# Patient Record
Sex: Female | Born: 1970 | Hispanic: No | Marital: Single | Smoking: Former smoker
Health system: Southern US, Community
[De-identification: ages and names within clinical notes are randomized; demographics above are authoritative.]

## PROBLEM LIST (undated history)

## (undated) DIAGNOSIS — I499 Cardiac arrhythmia, unspecified: Secondary | ICD-10-CM

## (undated) DIAGNOSIS — Z789 Other specified health status: Secondary | ICD-10-CM

## (undated) DIAGNOSIS — E785 Hyperlipidemia, unspecified: Secondary | ICD-10-CM

## (undated) DIAGNOSIS — I10 Essential (primary) hypertension: Secondary | ICD-10-CM

## (undated) HISTORY — PX: TUBAL LIGATION: SHX77

## (undated) HISTORY — DX: Essential (primary) hypertension: I10

## (undated) HISTORY — DX: Cardiac arrhythmia, unspecified: I49.9

## (undated) HISTORY — DX: Hyperlipidemia, unspecified: E78.5

---

## 1998-11-14 ENCOUNTER — Emergency Department (HOSPITAL_COMMUNITY): Admission: EM | Admit: 1998-11-14 | Discharge: 1998-11-14 | Payer: Self-pay | Admitting: Emergency Medicine

## 1998-11-14 ENCOUNTER — Encounter: Payer: Self-pay | Admitting: Emergency Medicine

## 1999-09-03 ENCOUNTER — Emergency Department (HOSPITAL_COMMUNITY): Admission: EM | Admit: 1999-09-03 | Discharge: 1999-09-03 | Payer: Self-pay | Admitting: Emergency Medicine

## 2000-05-14 ENCOUNTER — Emergency Department (HOSPITAL_COMMUNITY): Admission: EM | Admit: 2000-05-14 | Discharge: 2000-05-14 | Payer: Self-pay | Admitting: Emergency Medicine

## 2001-04-24 ENCOUNTER — Emergency Department (HOSPITAL_COMMUNITY): Admission: EM | Admit: 2001-04-24 | Discharge: 2001-04-25 | Payer: Self-pay

## 2001-10-20 ENCOUNTER — Emergency Department (HOSPITAL_COMMUNITY): Admission: EM | Admit: 2001-10-20 | Discharge: 2001-10-20 | Payer: Self-pay | Admitting: *Deleted

## 2002-03-15 ENCOUNTER — Emergency Department (HOSPITAL_COMMUNITY): Admission: EM | Admit: 2002-03-15 | Discharge: 2002-03-15 | Payer: Self-pay | Admitting: *Deleted

## 2002-10-07 ENCOUNTER — Inpatient Hospital Stay (HOSPITAL_COMMUNITY): Admission: AD | Admit: 2002-10-07 | Discharge: 2002-10-07 | Payer: Self-pay | Admitting: Obstetrics & Gynecology

## 2007-03-08 ENCOUNTER — Emergency Department (HOSPITAL_COMMUNITY): Admission: EM | Admit: 2007-03-08 | Discharge: 2007-03-08 | Payer: Self-pay | Admitting: Emergency Medicine

## 2016-12-15 ENCOUNTER — Ambulatory Visit (HOSPITAL_COMMUNITY)
Admission: EM | Admit: 2016-12-15 | Discharge: 2016-12-15 | Disposition: A | Discharge status: Home / Self Care | Payer: BLUE CROSS/BLUE SHIELD | Attending: Family Medicine | Admitting: Family Medicine

## 2016-12-15 ENCOUNTER — Encounter (HOSPITAL_COMMUNITY): Payer: Self-pay | Admitting: Emergency Medicine

## 2016-12-15 DIAGNOSIS — Z3202 Encounter for pregnancy test, result negative: Secondary | ICD-10-CM | POA: Diagnosis not present

## 2016-12-15 DIAGNOSIS — R300 Dysuria: Secondary | ICD-10-CM | POA: Diagnosis not present

## 2016-12-15 DIAGNOSIS — N10 Acute tubulo-interstitial nephritis: Secondary | ICD-10-CM | POA: Diagnosis not present

## 2016-12-15 DIAGNOSIS — R109 Unspecified abdominal pain: Secondary | ICD-10-CM

## 2016-12-15 LAB — POCT URINALYSIS DIP (DEVICE)
Bilirubin Urine: NEGATIVE
Glucose, UA: NEGATIVE mg/dL
Nitrite: NEGATIVE
Protein, ur: 100 mg/dL — AB
Specific Gravity, Urine: 1.015 (ref 1.005–1.030)
Urobilinogen, UA: 1 mg/dL (ref 0.0–1.0)
pH: 5.5 (ref 5.0–8.0)

## 2016-12-15 LAB — POCT PREGNANCY, URINE: Preg Test, Ur: NEGATIVE

## 2016-12-15 MED ORDER — KETOROLAC TROMETHAMINE 30 MG/ML IJ SOLN
INTRAMUSCULAR | Status: AC
  Filled 2016-12-15: qty 1

## 2016-12-15 MED ORDER — LIDOCAINE HCL (PF) 1 % IJ SOLN
INTRAMUSCULAR | Status: AC
  Filled 2016-12-15: qty 2

## 2016-12-15 MED ORDER — CIPROFLOXACIN HCL 500 MG PO TABS: 500.0000 mg | ORAL_TABLET | Freq: Two times a day (BID) | ORAL | 0 refills | Status: DC

## 2016-12-15 MED ORDER — CEFTRIAXONE SODIUM 1 G IJ SOLR
INTRAMUSCULAR | Status: AC
  Filled 2016-12-15: qty 10

## 2016-12-15 MED ORDER — CEFTRIAXONE SODIUM 1 G IJ SOLR
1.0000 g | Freq: Once | INTRAMUSCULAR | Status: AC
  Administered 2016-12-15: 1 g via INTRAMUSCULAR

## 2016-12-15 MED ORDER — ONDANSETRON 4 MG PO TBDP: 4.0000 mg | ORAL_TABLET | Freq: Three times a day (TID) | ORAL | 0 refills | Status: DC | PRN

## 2016-12-15 MED ORDER — KETOROLAC TROMETHAMINE 30 MG/ML IJ SOLN
30.0000 mg | Freq: Once | INTRAMUSCULAR | Status: AC
  Administered 2016-12-15: 30 mg via INTRAMUSCULAR

## 2016-12-15 NOTE — ED Triage Notes (Signed)
The patient presented to the [facility] with a complaint of right flank and right lower back paint that started this am. The patient denied dysuria but did report urinary frequency.

## 2016-12-15 NOTE — Discharge Instructions (Signed)
Please return here or to the Emergency Department immediately should you feel worse in any way or have any of the following symptoms: increasing or different abdominal pain, vomiting, increasing fevers, or shaking chills.

## 2016-12-18 NOTE — ED Provider Notes (Signed)
  [place]    ASSESSMENT & PLAN:  1. Acute pyelonephritis     Meds ordered this encounter  Medications  . ciprofloxacin (CIPRO) 500 MG tablet    Sig: Take 1 tablet (500 mg total) by mouth 2 (two) times daily.    Dispense:  14 tablet    Refill:  0  . ketorolac (TORADOL) 30 MG/ML injection 30 mg  . cefTRIAXone (ROCEPHIN) injection 1 g  . ondansetron (ZOFRAN-ODT) 4 MG disintegrating tablet    Sig: Take 1 tablet (4 mg total) by mouth every 8 (eight) hours as needed for nausea or vomiting.    Dispense:  15 tablet    Refill:  0   Ensure adequate fluid intake. Feeling much better after IM Toradol. Begin oral antibiotic tonight. Urine culture sent. Will notify patient when results available. Will follow up with her PCP or here if not showing improvement over the next 48 hours, sooner if needed.  Outlined signs and symptoms indicating need for more acute intervention. Patient verbalized understanding. After Visit Summary given.  SUBJECTIVE:  Brenda Sweeney is a 46 y.o. female who complains of urinary frequency, urgency and dysuria (burning) for the past 1-2 days. Mild R flank pain. Subjective fever and chills. No abnormal vaginal discharge or bleeding. Hematuria: not present. Normal PO intake. No abdominal pain. No self treatment. No specific aggravating or alleviating factors reported.  LMP: Patient's last menstrual period was 12/14/2016 (exact date).  ROS: As in HPI. All other systems negative.  OBJECTIVE:  Vitals:   12/15/16 1809  BP: (!) 121/58  Pulse: 88  Resp: 20  Temp: 100.2 F (37.9 C)  TempSrc: Oral  SpO2: 98%   No apparent distress. CV: RRR. Lungs: CTAB. Abdomen is soft without tenderness, guarding, mass, rebound or organomegaly. Mild CVA tenderness on the R. No inguinal adenopathy noted. Ext: No edema. Psych: A&O  Labs Reviewed  POCT URINALYSIS DIP (DEVICE) - Abnormal; Notable for the following:       Result Value   Ketones, ur  TRACE (*)    Hgb urine dipstick LARGE (*)    Protein, ur 100 (*)    Leukocytes, UA SMALL (*)    All other components within normal limits  POCT PREGNANCY, URINE    Allergies  Allergen Reactions  . Prozac [Fluoxetine Hcl] Other (See Comments)    Made my face lock up    No PMH of kidney infection.  Social History   Social History  . Marital status: Single    Spouse name: N/A  . Number of children: N/A  . Years of education: N/A   Occupational History  . Not on file.   Social History Main Topics  . Smoking status: Current Every Day Smoker    Packs/day: 1.00    Years: 30.00    Types: Cigarettes  . Smokeless tobacco: Never Used  . Alcohol use No  . Drug use: No  . Sexual activity: Not on file   Other Topics Concern  . Not on file   Social History Narrative  . No narrative on file   No FH of renal dz.    Vanessa Kick, MD 12/18/16 1146

## 2018-09-05 ENCOUNTER — Emergency Department (HOSPITAL_COMMUNITY)
Admission: EM | Admit: 2018-09-05 | Discharge: 2018-09-05 | Disposition: A | Discharge status: Home / Self Care | Payer: BLUE CROSS/BLUE SHIELD | Attending: Emergency Medicine | Admitting: Emergency Medicine

## 2018-09-05 ENCOUNTER — Other Ambulatory Visit: Payer: Self-pay

## 2018-09-05 DIAGNOSIS — J0300 Acute streptococcal tonsillitis, unspecified: Secondary | ICD-10-CM | POA: Insufficient documentation

## 2018-09-05 DIAGNOSIS — J020 Streptococcal pharyngitis: Secondary | ICD-10-CM

## 2018-09-05 DIAGNOSIS — F17210 Nicotine dependence, cigarettes, uncomplicated: Secondary | ICD-10-CM | POA: Insufficient documentation

## 2018-09-05 LAB — GROUP A STREP BY PCR: Group A Strep by PCR: DETECTED — AB

## 2018-09-05 MED ORDER — LIDOCAINE VISCOUS HCL 2 % MT SOLN
15.0000 mL | Freq: Once | OROMUCOSAL | Status: AC
  Administered 2018-09-05: 15 mL via OROMUCOSAL
  Filled 2018-09-05: qty 15

## 2018-09-05 MED ORDER — DEXAMETHASONE 4 MG PO TABS
10.0000 mg | ORAL_TABLET | Freq: Once | ORAL | Status: AC
Start: 2018-09-05 — End: 2018-09-05
  Administered 2018-09-05: 10 mg via ORAL
  Filled 2018-09-05: qty 3

## 2018-09-05 MED ORDER — PENICILLIN G BENZATHINE 1200000 UNIT/2ML IM SUSP
1.2000 10*6.[IU] | Freq: Once | INTRAMUSCULAR | Status: DC
  Filled 2018-09-05: qty 2

## 2018-09-05 NOTE — ED Provider Notes (Signed)
[facility] EMERGENCY DEPARTMENT Provider Note   CSN: [id number] Arrival date & time: 09/05/18  1726     History   Chief Complaint Chief Complaint  Patient presents with  . Sore Throat    HPI Brenda Sweeney is a 48 y.o. female who presents with a sore throat.  No significant past medical history.  The patient states for the past week and a half she has had a sore throat with painful swallowing.  The left side of her neck is tender.  She called out of work all last week because it hurt so much.  She has not taken anything for pain.  She has been doing warm salt gargles which is provided temporary relief.  She denies fever, chills, headache, ear pain, runny nose, cough or congestion.  Her coworkers son had strep throat but she has not had any direct sick contacts.  He is a current smoker.  No history of reflux.     HPI  No past medical history on file.  There are no active problems to display for this patient.   Past Surgical History:  Procedure Laterality Date  . TUBAL LIGATION       OB History   No obstetric history on file.      Home Medications    Prior to Admission medications   Medication Sig Start Date End Date Taking? Authorizing Provider  ciprofloxacin (CIPRO) 500 MG tablet Take 1 tablet (500 mg total) by mouth 2 (two) times daily. 12/15/16   Vanessa Kick, MD  ondansetron (ZOFRAN-ODT) 4 MG disintegrating tablet Take 1 tablet (4 mg total) by mouth every 8 (eight) hours as needed for nausea or vomiting. 12/15/16   Vanessa Kick, MD    Family History No family history on file.  Social History Social History   Tobacco Use  . Smoking status: Current Every Day Smoker    Packs/day: 1.00    Years: 30.00    Pack years: 30.00    Types: Cigarettes  . Smokeless tobacco: Never Used  Substance Use Topics  . Alcohol use: No  . Drug use: No     Allergies   Prozac [fluoxetine hcl]   Review of Systems Review of Systems   Constitutional: Negative for fever.  HENT: Positive for sore throat.      Physical Exam Updated Vital Signs BP (!) 137/47   Pulse 77   Temp 99.2 F (37.3 C) (Oral)   Resp 18   Ht 5\' 5"  (1.651 m)   Wt 70.3 kg   SpO2 100%   BMI 25.79 kg/m   Physical Exam Vitals signs and nursing note reviewed.  Constitutional:      General: She is not in acute distress.    Appearance: Normal appearance. She is well-developed. She is not ill-appearing.  HENT:     Head: Normocephalic and atraumatic.     Right Ear: Tympanic membrane normal.     Left Ear: Tympanic membrane normal.     Nose: Nose normal.     Mouth/Throat:     Lips: Pink.     Mouth: Mucous membranes are moist.     Pharynx: Uvula midline. Pharyngeal swelling and posterior oropharyngeal erythema present.     Tonsils: No tonsillar exudate or tonsillar abscesses.  Eyes:     General: No scleral icterus.       Right eye: No discharge.        Left eye: No discharge.     Conjunctiva/sclera: Conjunctivae normal.  Pupils: Pupils are equal, round, and reactive to light.  Neck:     Musculoskeletal: Normal range of motion.  Cardiovascular:     Rate and Rhythm: Normal rate.  Pulmonary:     Effort: Pulmonary effort is normal. No respiratory distress.  Abdominal:     General: There is no distension.  Skin:    General: Skin is warm and dry.  Neurological:     Mental Status: She is alert and oriented to person, place, and time.  Psychiatric:        Behavior: Behavior normal.      ED Treatments / Results  Labs (all labs ordered are listed, but only abnormal results are displayed) Labs Reviewed  GROUP A STREP BY PCR - Abnormal; Notable for the following components:      Result Value   Group A Strep by PCR DETECTED (*)    All other components within normal limits    EKG    Radiology No results found.  Procedures Procedures (including critical care time)  Medications Ordered in ED Medications  penicillin g  benzathine (BICILLIN LA) 1200000 UNIT/2ML injection 1.2 Million Units (has no administration in time range)  dexamethasone (DECADRON) tablet 10 mg (10 mg Oral Given 09/05/18 1826)  lidocaine (XYLOCAINE) 2 % viscous mouth solution 15 mL (15 mLs Mouth/Throat Given 09/05/18 1827)     Initial Impression / Assessment and Plan / ED Course  I have reviewed the triage vital signs and the nursing notes.  Pertinent labs & imaging results that were available during my care of the patient were reviewed by me and considered in my medical decision making (see chart for details).  48 year old female presents with sore throat for over a week.  Vital signs are normal.  On exam she has pharyngeal erythema without tonsillar exudate or swelling.  Strep test is positive.  She is given a dose of Decadron and lidocaine and felt better afterwards.  She is opting for IM PCN.  Work note was given  Final Clinical Impressions(s) / ED Diagnoses   Final diagnoses:  Strep pharyngitis    ED Discharge Orders    None       Recardo Evangelist, PA-C 09/05/18 1853    Daleen Bo, MD 09/07/18 1021

## 2018-09-05 NOTE — ED Triage Notes (Signed)
Pt reports painful swallowing for the last week. Denies recent fever. Vss.

## 2019-10-06 ENCOUNTER — Encounter (HOSPITAL_COMMUNITY): Payer: Self-pay | Admitting: Emergency Medicine

## 2019-10-06 ENCOUNTER — Emergency Department (HOSPITAL_COMMUNITY)
Admission: EM | Admit: 2019-10-06 | Discharge: 2019-10-06 | Disposition: A | Discharge status: Home / Self Care | Payer: Self-pay | Attending: Emergency Medicine | Admitting: Emergency Medicine

## 2019-10-06 ENCOUNTER — Emergency Department (HOSPITAL_COMMUNITY): Payer: Self-pay

## 2019-10-06 ENCOUNTER — Other Ambulatory Visit: Payer: Self-pay

## 2019-10-06 DIAGNOSIS — R109 Unspecified abdominal pain: Secondary | ICD-10-CM | POA: Insufficient documentation

## 2019-10-06 DIAGNOSIS — D649 Anemia, unspecified: Secondary | ICD-10-CM | POA: Insufficient documentation

## 2019-10-06 DIAGNOSIS — F17210 Nicotine dependence, cigarettes, uncomplicated: Secondary | ICD-10-CM | POA: Insufficient documentation

## 2019-10-06 DIAGNOSIS — N3000 Acute cystitis without hematuria: Secondary | ICD-10-CM | POA: Insufficient documentation

## 2019-10-06 DIAGNOSIS — M549 Dorsalgia, unspecified: Secondary | ICD-10-CM | POA: Insufficient documentation

## 2019-10-06 DIAGNOSIS — N840 Polyp of corpus uteri: Secondary | ICD-10-CM | POA: Insufficient documentation

## 2019-10-06 DIAGNOSIS — M25551 Pain in right hip: Secondary | ICD-10-CM | POA: Insufficient documentation

## 2019-10-06 LAB — CBC WITH DIFFERENTIAL/PLATELET
Abs Immature Granulocytes: 0.05 10*3/uL (ref 0.00–0.07)
Basophils Absolute: 0.1 10*3/uL (ref 0.0–0.1)
Basophils Relative: 1 %
Eosinophils Absolute: 0.4 10*3/uL (ref 0.0–0.5)
Eosinophils Relative: 3 %
HCT: 32.1 % — ABNORMAL LOW (ref 36.0–46.0)
Hemoglobin: 9.4 g/dL — ABNORMAL LOW (ref 12.0–15.0)
Immature Granulocytes: 0 %
Lymphocytes Relative: 29 %
Lymphs Abs: 3.3 10*3/uL (ref 0.7–4.0)
MCH: 23.7 pg — ABNORMAL LOW (ref 26.0–34.0)
MCHC: 29.3 g/dL — ABNORMAL LOW (ref 30.0–36.0)
MCV: 80.9 fL (ref 80.0–100.0)
Monocytes Absolute: 0.9 10*3/uL (ref 0.1–1.0)
Monocytes Relative: 8 %
Neutro Abs: 6.8 10*3/uL (ref 1.7–7.7)
Neutrophils Relative %: 59 %
Platelets: 582 10*3/uL — ABNORMAL HIGH (ref 150–400)
RBC: 3.97 MIL/uL (ref 3.87–5.11)
RDW: 17.4 % — ABNORMAL HIGH (ref 11.5–15.5)
WBC: 11.5 10*3/uL — ABNORMAL HIGH (ref 4.0–10.5)
nRBC: 0 % (ref 0.0–0.2)

## 2019-10-06 LAB — BASIC METABOLIC PANEL
Anion gap: 9 (ref 5–15)
BUN: 11 mg/dL (ref 6–20)
CO2: 25 mmol/L (ref 22–32)
Calcium: 9.5 mg/dL (ref 8.9–10.3)
Chloride: 105 mmol/L (ref 98–111)
Creatinine, Ser: 1.01 mg/dL — ABNORMAL HIGH (ref 0.44–1.00)
GFR calc Af Amer: >60 mL/min (ref >60)
GFR calc non Af Amer: >60 mL/min (ref >60)
Glucose, Bld: 106 mg/dL — ABNORMAL HIGH (ref 70–99)
Potassium: 4.1 mmol/L (ref 3.5–5.1)
Sodium: 139 mmol/L (ref 135–145)

## 2019-10-06 LAB — URINALYSIS, ROUTINE W REFLEX MICROSCOPIC
Bilirubin Urine: NEGATIVE
Glucose, UA: NEGATIVE mg/dL
Ketones, ur: NEGATIVE mg/dL
Nitrite: NEGATIVE
Protein, ur: NEGATIVE mg/dL
Specific Gravity, Urine: 1.02 (ref 1.005–1.030)
pH: 6 (ref 5.0–8.0)

## 2019-10-06 LAB — I-STAT BETA HCG BLOOD, ED (MC, WL, AP ONLY): I-stat hCG, quantitative: <5 m[IU]/mL (ref <5)

## 2019-10-06 MED ORDER — CEPHALEXIN 500 MG PO CAPS: 500.0000 mg | ORAL_CAPSULE | Freq: Three times a day (TID) | ORAL | 0 refills | Status: DC

## 2019-10-06 MED ORDER — IOHEXOL 300 MG/ML  SOLN
100.0000 mL | Freq: Once | INTRAMUSCULAR | Status: AC | PRN
  Administered 2019-10-06: 100 mL via INTRAVENOUS

## 2019-10-06 NOTE — ED Triage Notes (Signed)
Patient reports left flank pain for 1 month, denies injury , no dysuria or hematuria , patient added right hip pain for 3 months.

## 2019-10-06 NOTE — ED Provider Notes (Signed)
[facility] EM[facility]GENCY DEPARTMENT Provider Note   CSN: [id number] Arrival date & time: 10/06/19  0211     History Chief Complaint  Patient presents with  . Flank Pain    Brenda Sweeney is a 49 y.o. female.  Patient with no sniffing past medical history presents the emergency department today with complaint of left-sided flank pain and right hip pain.  Patient states that she began having pain over the lateral portion of her right hip which is much worse with activity movement such as walking upstairs proximately 4 months ago.  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  About a month ago she developed a dull pain in her left flank, left lateral ribs which radiated into her back.  This was mild at first but as become progressively more severe.  Currently it is waxing and waning but typically always present.  It hurts with movement and when she palpates the area.  It hurts when she takes a deep breath in.  She denies cough or shortness of breath.  No fever, nausea, vomiting, or diarrhea.  No hematuria or irritative UTI symptoms including dysuria, increased frequency or urgency.  Patient reports chronic weight fluctuations but no recent significant weight gain or loss.  She has not taken any medications for her symptoms stating she does not like to take medications.        History reviewed. No pertinent past medical history.  There are no problems to display for this patient.   Past Surgical History:  Procedure Laterality Date  . TUBAL LIGATION       OB History   No obstetric history on file.     No family history on file.  Social History   Tobacco Use  . Smoking status: Current Every Day Smoker    Packs/day: 1.00    Years: 30.00    Pack years: 30.00    Types: Cigarettes  . Smokeless tobacco: Never Used  Vaping Use  . Vaping Use: Never used  Substance Use Topics  . Alcohol use: No  . Drug use: No    Home  Medications Prior to Admission medications   Medication Sig Start Date End Date Taking? Authorizing Provider  ciprofloxacin (CIPRO) 500 MG tablet Take 1 tablet (500 mg total) by mouth 2 (two) times daily. 12/15/16   Vanessa Kick, MD  ondansetron (ZOFRAN-ODT) 4 MG disintegrating tablet Take 1 tablet (4 mg total) by mouth every 8 (eight) hours as needed for nausea or vomiting. 12/15/16   Vanessa Kick, MD    Allergies    Prozac [fluoxetine hcl]  Review of Systems   Review of Systems  Constitutional: Negative for fever.  HENT: Negative for rhinorrhea and sore throat.   Eyes: Negative for redness.  Respiratory: Negative for cough.   Cardiovascular: Negative for chest pain.  Gastrointestinal: Negative for abdominal pain, diarrhea, nausea and vomiting.  Genitourinary: Positive for flank pain. Negative for dysuria, frequency, hematuria, urgency, vaginal bleeding and vaginal discharge.  Musculoskeletal: Positive for arthralgias and back pain. Negative for myalgias.  Skin: Negative for rash.  Neurological: Negative for headaches.    Physical Exam Updated Vital Signs BP (!) 134/67 (BP Location: Left Arm)   Pulse 70   Temp 97.9 F (36.6 C) (Oral)   Resp 17   Ht 5\' 5"  (1.651 m)   Wt 80 kg   LMP 09/22/2019   SpO2 100%   BMI 29.35 kg/m   Physical Exam Vitals and  nursing note reviewed.  Constitutional:      Appearance: She is well-developed.  HENT:     Head: Normocephalic and atraumatic.  Eyes:     General:        Right eye: No discharge.        Left eye: No discharge.     Conjunctiva/sclera: Conjunctivae normal.  Cardiovascular:     Rate and Rhythm: Normal rate and regular rhythm.     Heart sounds: Normal heart sounds.  Pulmonary:     Effort: Pulmonary effort is normal.     Breath sounds: Normal breath sounds.  Abdominal:     Palpations: Abdomen is soft.     Tenderness: There is abdominal tenderness. There is no right CVA tenderness or left CVA tenderness.     Comments:  Patient with tenderness to palpation over the left lateral abdomen starting at just below the ribs and extending up into the mid ribs.  Musculoskeletal:        General: Tenderness present.     Cervical back: Normal range of motion and neck supple.     Comments: Patient with tenderness of the right hip just posterior to the greater trochanter of the femur.  No swelling or skin changes.  Skin:    General: Skin is warm and dry.  Neurological:     Mental Status: She is alert.     ED Results / Procedures / Treatments   Labs (all labs ordered are listed, but only abnormal results are displayed) Labs Reviewed  URINALYSIS, ROUTINE W REFLEX MICROSCOPIC - Abnormal; Notable for the following components:      Result Value   APPearance HAZY (*)    Hgb urine dipstick SMALL (*)    Leukocytes,Ua LARGE (*)    Bacteria, UA RARE (*)    All other components within normal limits  CBC WITH DIFFERENTIAL/PLATELET - Abnormal; Notable for the following components:   WBC 11.5 (*)    Hemoglobin 9.4 (*)    HCT 32.1 (*)    MCH 23.7 (*)    MCHC 29.3 (*)    RDW 17.4 (*)    Platelets 582 (*)    All other components within normal limits  BASIC METABOLIC PANEL - Abnormal; Notable for the following components:   Glucose, Bld 106 (*)    Creatinine, Ser 1.01 (*)    All other components within normal limits  URINE CULTURE  I-STAT BETA HCG BLOOD, ED ([facility], [facility], [facility] ONLY)    EKG None  Radiology DG Chest 2 View  Result Date: 10/06/2019 CLINICAL DATA:  49 year old female with history of left lateral flank and chest pain. EXAM: CHEST - 2 VIEW COMPARISON:  No priors. FINDINGS: Lung volumes are normal. No consolidative airspace disease. No pleural effusions. No pneumothorax. No pulmonary nodule or mass noted. Pulmonary vasculature and the cardiomediastinal silhouette are within normal limits. IMPRESSION: No radiographic evidence of acute cardiopulmonary disease. Electronically Signed   By: Vinnie Langton M.D.   On:  10/06/2019 10:32   CT ABDOMEN PELVIS W CONTRAST  Result Date: 10/06/2019 CLINICAL DATA:  49 year old female with history of left lateral flank pain for 1 month and right lateral hip pain for 1 month. EXAM: CT ABDOMEN AND PELVIS WITH CONTRAST TECHNIQUE: Multidetector CT imaging of the abdomen and pelvis was performed using the standard protocol following bolus administration of intravenous contrast. CONTRAST:  143mL OMNIPAQUE IOHEXOL 300 MG/ML  SOLN COMPARISON:  No priors. FINDINGS: Lower chest: Mild linear scarring in the left lower lobe.  Hepatobiliary: Subcentimeter low-attenuation lesions between segments 2 and 3 of the liver and in segment 5 of the liver, too small to definitively characterize, but favored to represent tiny cysts and/or biliary hamartomas. No other suspicious hepatic lesions. No intra or extrahepatic biliary ductal dilatation. Gallbladder is normal in appearance. Pancreas: No pancreatic mass. No pancreatic ductal dilatation. No pancreatic or peripancreatic fluid collections or inflammatory changes. Spleen: Unremarkable. Adrenals/Urinary Tract: Bilateral kidneys and adrenal glands are normal in appearance. No hydroureteronephrosis. Urinary bladder is normal in appearance. Stomach/Bowel: The appearance of the stomach is normal. No pathologic dilatation of small bowel or colon. Normal appendix. Vascular/Lymphatic: No significant atherosclerotic disease, aneurysm or dissection noted in the abdominal or pelvic vasculature no lymphadenopathy noted in the abdomen or pelvis. Reproductive: There is a hypervascular lesion which appears pedunculated extending from the endometrial canal into the lower uterine segment likely partially prolapsing out of the cervical os (axial image 80 of series 3 and sagittal image 63 of series 7) estimated to measure approximately 5.8 x 2.5 x 3.5 cm, likely to represent a large endometrial polyp. Ovaries are unremarkable in appearance. Other: Trace volume of free fluid in  the cul-de-sac likely physiologic. No larger volume of ascites. No pneumoperitoneum. Musculoskeletal: There are no aggressive appearing lytic or blastic lesions noted in the visualized portions of the skeleton. IMPRESSION: 1. No acute findings are noted in the abdomen or pelvis to account for the patient's symptoms. 2. Large lesion in the uterus suspicious for prolapsing endometrial polyp. Further evaluation with pelvic ultrasound is recommended. 3. Small volume of free fluid in the cul-de-sac, presumably physiologic. 4. Additional incidental findings, as above. Electronically Signed   By: Vinnie Langton M.D.   On: 10/06/2019 10:32    Procedures Procedures (including critical care time)  Medications Ordered in ED Medications - No data to display  ED Course  I have reviewed the triage vital signs and the nursing notes.  Pertinent labs & imaging results that were available during my care of the patient were reviewed by me and considered in my medical decision making (see chart for details).  Patient seen and examined.  Patient has unusual constellation of symptoms.  This may be musculoskeletal pain related to her job, however duration and progression of symptoms is concerning.  She has a mildly elevated white blood cell count.  She has significant red and white blood cells in her urine but does not have typical history and symptomatology of cystitis or pyelonephritis.  Patient does have tenderness over her left flank but also extends more superiorly onto the lateral ribs and middle back.  Will obtain chest x-ray and CT of the abdomen pelvis to evaluate for kidney or other abdominal pathology.  We should be able to see the area of the right hip on CT imaging as well.  Patient agrees to proceed with this plan.  Vital signs reviewed and are as follows: BP (!) 134/67 (BP Location: Left Arm)   Pulse 70   Temp 97.9 F (36.6 C) (Oral)   Resp 17   Ht 5\' 5"  (1.651 m)   Wt 80 kg   LMP 09/22/2019    SpO2 100%   BMI 29.35 kg/m   11:00 AM results reviewed with patient at bedside.  We discussed her CT findings which were reassuring.  We did discuss endometrial polyp and need to follow-up with OB/GYN for this.  Discussed negative chest x-ray.  We also talked about her urine test.  Will give trial of Keflex to  see if this helps.  Urine culture ordered.  We also discussed lab work finding including anemia.  At this point, patient would not require transfusion as this is likely chronic, however patient strongly advised to follow-up with PCP for this.  I provided her with referral to the [facility] outpatient clinic as well as the [facility] health and wellness clinic.  At this point patient is comfortable discharged home.  Encouraged rest, use of OTC meds as needed for her symptoms.  The patient was urged to return to the Emergency Department immediately with worsening of current symptoms, worsening abdominal pain, persistent vomiting, blood noted in stools, fever, or any other concerns. The patient verbalized understanding.       MDM Rules/Calculators/A&P                          Patient with left sided pain and right hip pain, worse with movement and palpation.  Imaging today does not reveal cause of her symptoms.  I have low suspicion for PE, ACS, pyelonephritis.  Possibly musculoskeletal in nature.  Work-up did reveal the following problems.  Labs are largely unremarkable.  Hepatic function panel and lipase were not ordered on initial arrival.  I do not feel the patient needs these at this time given the fact that she does not have clinical signs and symptoms of pancreatitis and no right upper quadrant tenderness or gallstones on CT scan.  Anemia: Hemoglobin 9.4.  Relatively symptomatic, no active bleeding.  Probably chronic.  Patient to follow-up with PCP for this.  Discussed signs and symptoms to return including bleeding, shortness of breath, extreme weakness.  Endometrial polyp: Patient  denies active or troublesome vaginal bleeding.  Follow-up with GYN for ultrasound recommended.  Referral given.  Possible UTI: Patient with red blood cells and white blood cells noted in the urine.  Patient denies vaginal bleeding.  She does not have classic UTI symptoms.  Urine culture sent.  Will treat with Keflex in the interim.  She does not have fever, vomiting to suggest pyelonephritis and the findings on CT scan.   Final Clinical Impression(s) / ED Diagnoses Final diagnoses:  Left flank pain  Right hip pain  Endometrial polyp  Acute cystitis without hematuria  Anemia, unspecified type    Rx / DC Orders ED Discharge Orders         Ordered    cephALEXin (KEFLEX) 500 MG capsule  3 times daily     Discontinue  Reprint     10/06/19 1055           Carlisle Cater, PA-C 10/06/19 1104    Davonna Belling, MD 10/06/19 1551

## 2019-10-06 NOTE — ED Notes (Signed)
Patient Alert and oriented to baseline. Stable and ambulatory to baseline. Patient verbalized understanding of the discharge instructions.  Patient belongings were taken by the patient.   

## 2019-10-06 NOTE — Discharge Instructions (Signed)
Please read and follow all provided instructions.  Your diagnoses today include:  1. Left flank pain   2. Right hip pain   3. Endometrial polyp   4. Acute cystitis without hematuria   5. Anemia, unspecified type     Tests performed today include:  Blood counts and electrolytes - showed slightly elevated white blood cell count, moderately low red blood cell count  Blood tests to kidney function - normal  Urine test to look for infection - shows blood and infection fighting cells in the urine  Chest x-ray - was normal  CT scan of your abdomen -did not show any significant problems however does show that you have a endometrial cyst.  This will need to be followed up by an OB/GYN.  See referral.  Vital signs. See below for your results today.   Medications prescribed:   Keflex (cephalexin) - antibiotic  You have been prescribed an antibiotic medicine: take the entire course of medicine even if you are feeling better. Stopping early can cause the antibiotic not to work.  Take any prescribed medications only as directed.  Home care instructions:   Follow any educational materials contained in this packet.  Please follow-up with the provided referrals.  You may use over-the-counter medications such as Tylenol, ibuprofen, or naproxen for any pain that you have.  Follow-up instructions: Please follow-up with your primary care provider in the next 7 days for further evaluation of your symptoms.  See referral.  Return instructions:  SEEK IMMEDIATE MEDICAL ATTENTION IF:  The pain does not go away or becomes severe   A temperature above 101F develops   Repeated vomiting occurs (multiple episodes)   The pain becomes localized to portions of the abdomen. The right side could possibly be appendicitis. In an adult, the left lower portion of the abdomen could be colitis or diverticulitis.   Blood is being passed in stools or vomit (bright red or black tarry stools)   You develop  chest pain, difficulty breathing, dizziness or fainting, or become confused, poorly responsive, or inconsolable (young children)  If you have any other emergent concerns regarding your health  Additional Information: Abdominal (belly) pain can be caused by many things. Your caregiver performed an examination and possibly ordered blood/urine tests and imaging (CT scan, x-rays, ultrasound). Many cases can be observed and treated at home after initial evaluation in the emergency department. Even though you are being discharged home, abdominal pain can be unpredictable. Therefore, you need a repeated exam if your pain does not resolve, returns, or worsens. Most patients with abdominal pain don't have to be admitted to the hospital or have surgery, but serious problems like appendicitis and gallbladder attacks can start out as nonspecific pain. Many abdominal conditions cannot be diagnosed in one visit, so follow-up evaluations are very important.  Your vital signs today were: BP (!) 134/67 (BP Location: Left Arm)    Pulse 70    Temp 97.9 F (36.6 C) (Oral)    Resp 17    Ht 5\' 5"  (1.651 m)    Wt 80 kg    LMP 09/22/2019 Comment: tubal ligation   SpO2 100%    BMI 29.35 kg/m  If your blood pressure (bp) was elevated above 135/85 this visit, please have this repeated by your doctor within one month. --------------

## 2019-10-07 LAB — URINE CULTURE: Culture: <10000 — AB

## 2019-11-02 ENCOUNTER — Inpatient Hospital Stay: Payer: Self-pay | Admitting: Internal Medicine

## 2019-12-12 ENCOUNTER — Other Ambulatory Visit: Payer: Self-pay

## 2019-12-12 ENCOUNTER — Inpatient Hospital Stay (HOSPITAL_COMMUNITY)
Admission: AD | Admit: 2019-12-12 | Discharge: 2019-12-12 | Disposition: A | Discharge status: Home / Self Care | Payer: Self-pay | Attending: Emergency Medicine | Admitting: Emergency Medicine

## 2019-12-12 ENCOUNTER — Encounter (HOSPITAL_COMMUNITY): Payer: Self-pay

## 2019-12-12 DIAGNOSIS — R3915 Urgency of urination: Secondary | ICD-10-CM | POA: Insufficient documentation

## 2019-12-12 DIAGNOSIS — R300 Dysuria: Secondary | ICD-10-CM | POA: Insufficient documentation

## 2019-12-12 DIAGNOSIS — R1032 Left lower quadrant pain: Secondary | ICD-10-CM | POA: Insufficient documentation

## 2019-12-12 DIAGNOSIS — N840 Polyp of corpus uteri: Secondary | ICD-10-CM

## 2019-12-12 DIAGNOSIS — Z9851 Tubal ligation status: Secondary | ICD-10-CM | POA: Insufficient documentation

## 2019-12-12 DIAGNOSIS — N898 Other specified noninflammatory disorders of vagina: Secondary | ICD-10-CM | POA: Insufficient documentation

## 2019-12-12 DIAGNOSIS — R350 Frequency of micturition: Secondary | ICD-10-CM | POA: Insufficient documentation

## 2019-12-12 DIAGNOSIS — F17210 Nicotine dependence, cigarettes, uncomplicated: Secondary | ICD-10-CM | POA: Insufficient documentation

## 2019-12-12 DIAGNOSIS — M549 Dorsalgia, unspecified: Secondary | ICD-10-CM | POA: Insufficient documentation

## 2019-12-12 HISTORY — DX: Other specified health status: Z78.9

## 2019-12-12 LAB — URINALYSIS, ROUTINE W REFLEX MICROSCOPIC
Bilirubin Urine: NEGATIVE
Glucose, UA: NEGATIVE mg/dL
Ketones, ur: NEGATIVE mg/dL
Nitrite: NEGATIVE
Protein, ur: 30 mg/dL — AB
RBC / HPF: >50 RBC/hpf — ABNORMAL HIGH (ref 0–5)
Specific Gravity, Urine: 1.009 (ref 1.005–1.030)
WBC, UA: >50 WBC/hpf — ABNORMAL HIGH (ref 0–5)
pH: 5 (ref 5.0–8.0)

## 2019-12-12 MED ORDER — POLYETHYLENE GLYCOL 3350 17 G PO PACK: 17.0000 g | PACK | Freq: Every day | ORAL | 0 refills | Status: AC

## 2019-12-12 NOTE — ED Triage Notes (Signed)
Patient complains of vaginal discomfort and complains of funny feeling. States that she thinks something protruding from vagina. Reports urinating in small amounts and urgency. States this last a short time and feels like it goes back in.

## 2019-12-12 NOTE — ED Provider Notes (Signed)
MSE was initiated and I personally evaluated the patient and placed orders (if any) at  9:59 AM on December 12, 2019.  Briefly patient is a 49 year old female who presents to the emergency department with concern for abnormal sensation in her vagina.  She states that she feels like something is coming out of it.  She has had some urinary frequency and urgency as well.  On exam she is tearful and mildly tachycardic.  Overall nontoxic without significant abdominal tenderness.  10:09: CONSULT: I discussed with MAU APP Barnetta Chapel who accepts patient in transfer for evaluation.  The patient appears stable so that the remainder of the MSE may be completed by another provider.   Leafy Kindle 12/12/19 1015    Quintella Reichert, MD 12/12/19 1226

## 2019-12-12 NOTE — Discharge Instructions (Signed)
Pelvic Floor Dysfunction  Pelvic floor dysfunction (PFD) is a condition that results when the group of muscles and connective tissues that support the organs in the pelvis (pelvic floor muscles) do not work well. These muscles and their connections form a sling that supports the colon and bladder. In men, these muscles also support the prostate gland. In women, they also support the uterus. PFD causes pelvic floor muscles to be too weak, too tight, or a combination of both. In PFD, muscle movements are not coordinated. This condition may cause bowel or bladder problems. It may also cause pain. What are the causes? This condition may be caused by an injury to the pelvic area or by a weakening of pelvic muscles. This often results from pregnancy and childbirth or other types of strain. In many cases, the exact cause is not known. What increases the risk? The following factors may make you more likely to develop this condition:  Having a condition of chronic bladder tissue inflammation (interstitial cystitis).  Being an older person.  Being overweight.  Radiation treatment for cancer in the pelvic region.  Previous pelvic surgery, such as removal of the uterus (hysterectomy) or prostate gland (prostatectomy). What are the signs or symptoms? Symptoms of this condition vary and may include:  Bladder symptoms, such as: ? Trouble starting urination and emptying the bladder. ? Frequent urinary tract infections. ? Leaking urine when coughing, laughing, or exercising (stress incontinence). ? Having to pass urine urgently or frequently. ? Pain when passing urine.  Bowel symptoms, such as: ? Constipation. ? Urgent or frequent bowel movements. ? Incomplete bowel movements. ? Painful bowel movements. ? Leaking stool or gas.  Unexplained genital or rectal pain.  Genital or rectal muscle spasms.  Low back pain. In women, symptoms of PFD may also include:  A heavy, full, or aching feeling in  the vagina.  A bulge that protrudes into the vagina.  Pain during or after sexual intercourse. How is this diagnosed? This condition may be diagnosed based on:  Your symptoms and medical history.  A physical exam. During the exam, your health care provider may check your pelvic muscles for tightness, spasm, pain, or weakness. This may include a rectal exam and a pelvic exam for women. In some cases, you may have diagnostic tests, such as:  Electrical muscle function tests.  Urine flow testing.  X-ray tests of bowel function.  Ultrasound of the pelvic organs. How is this treated? Treatment for this condition depends on your symptoms. Treatment options include:  Physical therapy. This may include Kegel exercises to help relax or strengthen the pelvic floor muscles.  Biofeedback. This type of therapy provides feedback on how tight your pelvic floor muscles are so that you can learn to control them.  Internal or external massage therapy.  A treatment that involves electrical stimulation of the pelvic floor muscles to help control pain (transcutaneous electrical nerve stimulation, or TENS).  Sound wave therapy (ultrasound) to reduce muscle spasms.  Medicines, such as: ? Muscle relaxants. ? Bladder control medicines. Surgery to reconstruct or support pelvic floor muscles may be an option if other treatments do not help. Follow these instructions at home: Activity  Do your usual activities as told by your health care provider. Ask your health care provider if you should modify any activities.  Do pelvic floor strengthening or relaxing exercises at home as told by your physical therapist. Lifestyle  Maintain a healthy weight.  Eat foods that are high in fiber, such as  beans, whole grains, and fresh fruits and vegetables.  Limit foods that are high in fat and processed sugars, such as fried or sweet foods.  Manage stress with relaxation techniques such as yoga or  meditation. General instructions  If you have problems with leakage: ? Use absorbable pads or wear padded underwear. ? Wash frequently with mild soap. ? Keep your genital and anal area as clean and dry as possible. ? Ask your health care provider if you should try a barrier cream to prevent skin irritation.  Take warm baths to relieve pelvic muscle tension or spasms.  Take over-the-counter and prescription medicines only as told by your health care provider.  Keep all follow-up visits as told by your health care provider. This is important. Contact a health care provider if you:  Are not improving with home care.  Have signs or symptoms of PFD that get worse at home.  Develop new signs or symptoms at home.  Have signs of a urinary tract infection, such as: ? Fever. ? Chills. ? Urinary frequency. ? A burning feeling when urinating.  Have not had a bowel movement in 3 days (constipation). Summary  Pelvic floor dysfunction results when the muscles and connective tissues in your pelvic floor do not work well.  These muscles and their connections form a sling that supports your colon and bladder. In men, these muscles also support the prostate gland. In women, they also support the uterus.  PFD may be caused by an injury to the pelvic area or by a weakening of pelvic muscles.  PFD causes pelvic floor muscles to be too weak, too tight, or a combination of both. Symptoms may vary from person to person.  In most cases, PFD can be treated with physical therapies and medicines. Surgery may be an option if other treatments do not help. This information is not intended to replace advice given to you by your health care provider. Make sure you discuss any questions you have with your health care provider. Document Revised: 09/19/2017 Document Reviewed: 09/19/2017 Elsevier Patient Education  2020 [employer]  

## 2019-12-12 NOTE — MAU Provider Note (Addendum)
History     CSN: [id number]  Arrival date and time: 12/12/19 9892   First Provider Initiated Contact with Patient 12/12/19 1330      Chief Complaint  Patient presents with  . Vaginal Prolapse   HPI: Brenda Sweeney is a 49 y.o. G51P1102 female here with complaints of "something in my vagina" for the past month. She states there is a bulge that appears from her introitus about 3-4x/day. These episodes occur when the patient coughs or strains with a bowel movement/trying to urinate. She described the bulging object as smooth and "not like the rest of her vagina." There is no associated pain in her vagina or pelvis. 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 PMH pertinent for polyp diagnosed in the ED in July, patient does not remember what kind. Her menstrual cycle has been irregular lately but she has had excessive flow, requiring > 130 tampons/month, for a few years. Endorses difficulty emptying her bladder (feels like she can't completely empty and has to strain) and having bowel movements. Last BM was this morning, however, she feels like she cannot pass stool normally, and has to strain while doing so.  Pertinent Gynecological History: Menses: flow is excessive, irregular occurring and usually lasting 3-4 weeks.  Bleeding: dysfunctional uterine bleeding Contraception: none DES exposure: denies Blood transfusions: none Sexually transmitted diseases: no past history Previous GYN Procedures: none  Last mammogram: unknown Date: unknown Last pap: unknown Date: unknown   Past Medical History:  Diagnosis Date  . Medical history non-contributory     Past Surgical History:  Procedure Laterality Date  . TUBAL LIGATION      No family history on file.  Social History   Tobacco Use  . Smoking status: Current Every Day Smoker    Packs/day: 1.00    Years: 30.00    Pack years: 30.00    Types: Cigarettes   . Smokeless tobacco: Never Used  Vaping Use  . Vaping Use: Never used  Substance Use Topics  . Alcohol use: No  . Drug use: No    Allergies:  Allergies  Allergen Reactions  . Prozac [Fluoxetine Hcl] Other (See Comments)    Made my face lock up    Medications Prior to Admission  Medication Sig Dispense Refill Last Dose  . BLACK ELDERBERRY,BERRY-FLOWER, PO Take 1 tablet by mouth.      . Melatonin 10 MG TABS Take 60 mg by mouth at bedtime as needed.     . Multiple Vitamin (MULTIVITAMIN) capsule Take 1 capsule by mouth daily.     . cephALEXin (KEFLEX) 500 MG capsule Take 1 capsule (500 mg total) by mouth 3 (three) times daily. 21 capsule 0     Review of Systems  Gastrointestinal: Positive for constipation.  Genitourinary: Positive for decreased urine volume, dysuria and frequency. Negative for pelvic pain, vaginal bleeding, vaginal discharge and vaginal pain.  Musculoskeletal: Positive for back pain.  All other systems reviewed and are negative.  Physical Exam   Blood pressure 112/88, pulse (!) 101, temperature 99.9 F (37.7 C), temperature source Oral, resp. rate 18, height 5\' 5"  (1.651 m), SpO2 100 %.  Physical Exam Vitals and nursing note reviewed. Exam conducted with a chaperone present.  Constitutional:      General: She is not in acute distress.    Appearance: Normal appearance.  Cardiovascular:     Pulses: Normal pulses.     Heart  sounds: Normal heart sounds. No murmur heard.   Pulmonary:     Effort: Pulmonary effort is normal.     Breath sounds: Normal breath sounds. No wheezing.  Abdominal:     General: Abdomen is flat.     Palpations: Abdomen is soft.     Tenderness: There is abdominal tenderness in the left lower quadrant. There is no guarding or rebound.  Genitourinary:    General: Normal vulva.     Labia:        Right: No rash or lesion.        Left: No rash or lesion.      Comments: Red, firm, hypervascular mass protruding from the cervix (by  palpation). No rectocele or cystocele seen. The mass is about the size of a Kiwi and is non-friable on exam, no bleeding seen. Skin:    General: Skin is warm and dry.     Capillary Refill: Capillary refill takes less than 2 seconds.  Neurological:     Mental Status: She is alert and oriented to person, place, and time.   Media Information   Document Information  Photos  Endometrial polyp  12/12/2019 14:05  Attached To:  Hospital Encounter on 12/12/19  Source Information  Elmer Merwin, Dan Europe, DO  [facility]     MAU Course   MDM -Prior imaging reviewed: CT done in July- Clinical impression: 1. No acute findings are noted in the abdomen or pelvis to account for the patient's symptoms. 2. Large lesion in the uterus suspicious for prolapsing endometrial polyp. Further evaluation with pelvic ultrasound is recommended. 3. Small volume of free fluid in the cul-de-sac, presumably physiologic. 4. Additional incidental findings, as above.  -No bleeding noted during speculum exam -Discussed case with Dr. Ilda Basset, recommended urgent GYN follow up in office this week, message sent to [facility].  Assessment and Plan  49 y.o. [phone] female here for complaints of "something in her vagina" - Based on physical exam and CT results fro July, favor enlarged endometrial ndometrial polyp  -Urgent referral to OBGYN at [place] at home to reduce staining with BM -DC to home with return precautions  Brenda Sweeney 12/12/2019, 2:24 PM

## 2019-12-12 NOTE — MAU Note (Signed)
Pt stated she felt something that was coming out of her vagina after she urinated. It then would go back up. Has been happening on and off for a month. Stated feels like sometime her urine stream is not strong. Also reports when her periods come it last about 3-4 weeks and is very heavy. Not currently bleeding now.

## 2019-12-14 ENCOUNTER — Ambulatory Visit (INDEPENDENT_AMBULATORY_CARE_PROVIDER_SITE_OTHER): Payer: Self-pay | Admitting: Obstetrics & Gynecology

## 2019-12-14 ENCOUNTER — Encounter: Payer: Self-pay | Admitting: Obstetrics & Gynecology

## 2019-12-14 ENCOUNTER — Encounter (HOSPITAL_BASED_OUTPATIENT_CLINIC_OR_DEPARTMENT_OTHER): Payer: Self-pay | Admitting: Obstetrics & Gynecology

## 2019-12-14 ENCOUNTER — Other Ambulatory Visit: Payer: Self-pay

## 2019-12-14 VITALS — BP 108/72 | HR 97 | Ht 65.0 in | Wt 179.0 lb

## 2019-12-14 DIAGNOSIS — N9489 Other specified conditions associated with female genital organs and menstrual cycle: Secondary | ICD-10-CM

## 2019-12-14 DIAGNOSIS — N939 Abnormal uterine and vaginal bleeding, unspecified: Secondary | ICD-10-CM

## 2019-12-14 NOTE — H&P (View-Only) (Signed)
GYNECOLOGY OFFICE VISIT NOTE  History:   Brenda Sweeney is a 49 y.o. [id number] here today for evaluation and management of prolapsing large endometrial polyp/fibroid.  Was seen in the MAU recently. Still has ongoing mild-moderate bleeding, but feels pressure from the mass. She denies any other concerns.    Past Medical History:  Diagnosis Date  . Medical history non-contributory     Past Surgical History:  Procedure Laterality Date  . TUBAL LIGATION      The following portions of the patient's history were reviewed and updated as appropriate: allergies, current medications, past family history, past medical history, past social history, past surgical history and problem list.   Health Maintenance:  No recent pap smears   Review of Systems:  Pertinent items noted in HPI and remainder of comprehensive ROS otherwise negative.  Physical Exam:  BP 108/72   Pulse 97   Ht 5\' 5"  (1.651 m)   Wt 179 lb (81.2 kg)   LMP 10/14/2019   BMI 29.79 kg/m  CONSTITUTIONAL: Well-developed, well-nourished female in no acute distress.  HEENT:  Normocephalic, atraumatic. External right and left ear normal. No scleral icterus.  NECK: Normal range of motion, supple, no masses noted on observation SKIN: No rash noted. Not diaphoretic. No erythema. No pallor. MUSCULOSKELETAL: Normal range of motion. No edema noted. NEUROLOGIC: Alert and oriented to person, place, and time. Normal muscle tone coordination. No cranial nerve deficit noted. PSYCHIATRIC: Normal mood and affect. Normal behavior. Normal judgment and thought content. CARDIOVASCULAR: Normal heart rate noted RESPIRATORY: Effort and breath sounds normal, no problems with respiration noted ABDOMEN: No masses noted. No other overt distention noted.   PELVIC: Large 5 cm mass, hypervascular protruding from cervix, unable to palpate stalk.  Unable to visualize cervix. Tender to touch. Exam performed in the presence of a  chaperone.  Labs and Imaging Results for orders placed or performed during the hospital encounter of 12/12/19 (from the past 168 hour(s))  Urinalysis, Routine w reflex microscopic Urine, Clean Catch   Collection Time: 12/12/19 11:35 AM  Result Value Ref Range   Color, Urine YELLOW YELLOW   APPearance HAZY (A) CLEAR   Specific Gravity, Urine 1.009 1.005 - 1.030   pH 5.0 5.0 - 8.0   Glucose, UA NEGATIVE NEGATIVE mg/dL   Hgb urine dipstick LARGE (A) NEGATIVE   Bilirubin Urine NEGATIVE NEGATIVE   Ketones, ur NEGATIVE NEGATIVE mg/dL   Protein, ur 30 (A) NEGATIVE mg/dL   Nitrite NEGATIVE NEGATIVE   Leukocytes,Ua LARGE (A) NEGATIVE   RBC / HPF >50 (H) 0 - 5 RBC/hpf   WBC, UA >50 (H) 0 - 5 WBC/hpf   Bacteria, UA RARE (A) NONE SEEN   Squamous Epithelial / LPF 0-5 0 - 5   Mucus PRESENT    DG Chest 2 View  Result Date: 10/06/2019 CLINICAL DATA:  49 year old female with history of left lateral flank and chest pain. EXAM: CHEST - 2 VIEW COMPARISON:  No priors. FINDINGS: Lung volumes are normal. No consolidative airspace disease. No pleural effusions. No pneumothorax. No pulmonary nodule or mass noted. Pulmonary vasculature and the cardiomediastinal silhouette are within normal limits. IMPRESSION: No radiographic evidence of acute cardiopulmonary disease. Electronically Signed   By: Vinnie Langton M.D.   On: 10/06/2019 10:32   CT ABDOMEN PELVIS W CONTRAST  Result Date: 10/06/2019 CLINICAL DATA:  49 year old female with history of left lateral flank pain for 1 month and right lateral hip pain for 1 month. EXAM: CT ABDOMEN AND  PELVIS WITH CONTRAST TECHNIQUE: Multidetector CT imaging of the abdomen and pelvis was performed using the standard protocol following bolus administration of intravenous contrast. CONTRAST:  177mL OMNIPAQUE IOHEXOL 300 MG/ML  SOLN COMPARISON:  No priors. FINDINGS: Lower chest: Mild linear scarring in the left lower lobe. Hepatobiliary: Subcentimeter low-attenuation lesions  between segments 2 and 3 of the liver and in segment 5 of the liver, too small to definitively characterize, but favored to represent tiny cysts and/or biliary hamartomas. No other suspicious hepatic lesions. No intra or extrahepatic biliary ductal dilatation. Gallbladder is normal in appearance. Pancreas: No pancreatic mass. No pancreatic ductal dilatation. No pancreatic or peripancreatic fluid collections or inflammatory changes. Spleen: Unremarkable. Adrenals/Urinary Tract: Bilateral kidneys and adrenal glands are normal in appearance. No hydroureteronephrosis. Urinary bladder is normal in appearance. Stomach/Bowel: The appearance of the stomach is normal. No pathologic dilatation of small bowel or colon. Normal appendix. Vascular/Lymphatic: No significant atherosclerotic disease, aneurysm or dissection noted in the abdominal or pelvic vasculature no lymphadenopathy noted in the abdomen or pelvis. Reproductive: There is a hypervascular lesion which appears pedunculated extending from the endometrial canal into the lower uterine segment likely partially prolapsing out of the cervical os (axial image 80 of series 3 and sagittal image 63 of series 7) estimated to measure approximately 5.8 x 2.5 x 3.5 cm, likely to represent a large endometrial polyp. Ovaries are unremarkable in appearance. Other: Trace volume of free fluid in the cul-de-sac likely physiologic. No larger volume of ascites. No pneumoperitoneum. Musculoskeletal: There are no aggressive appearing lytic or blastic lesions noted in the visualized portions of the skeleton. IMPRESSION: 1. No acute findings are noted in the abdomen or pelvis to account for the patient's symptoms. 2. Large lesion in the uterus suspicious for prolapsing endometrial polyp. Further evaluation with pelvic ultrasound is recommended. 3. Small volume of free fluid in the cul-de-sac, presumably physiologic. 4. Additional incidental findings, as above. Electronically Signed   By:  Vinnie Langton M.D.   On: 10/06/2019 10:32       Assessment and Plan:      1. Prolapsed endometrial mass 2. Abnormal uterine bleeding (AUB) Given patient's symptoms, counseled about need for surgical removal in the operating room. Also concerned about vascular nature of mass and being able to cauterize the base of the stalk after removal.  Recommended hysteroscopy, dilation and curettage, removal of endometrial mass, possible myomectomy.  This was scheduled on 12/19/2019 at 2 pm, preoperative instructions given to patient.  The risks of surgery were discussed in detail with the patient including but not limited to: bleeding; infection; injury to surrounding organs; formation of adhesions; need for additional procedures or subsequent procedures secondary to abnormal pathology; and other postoperative or anesthesia complications.  Patient was told that the likelihood that her condition and symptoms will be treated effectively with this surgical management was very high. All questions were answered.   She is aware of need for preoperative COVID testing and subsequent quarantine from time of test to time of surgery; she will be given further preoperative instructions by the preoperative team.  Printed patient education handouts about the procedure were given to the patient to review at home.   Please refer to After Visit Summary for other counseling recommendations.   Return in about 3 weeks (around 01/04/2020) for Postoperative follow up after surgery on 12/19/19.    Total face-to-face time with patient: 20 minutes.  Over 50% of encounter was spent on counseling and coordination of care.   Keishawna Carranza  Harolyn Rutherford, MD, [place], [facility] for [employer], [place]

## 2019-12-14 NOTE — Patient Instructions (Addendum)
If you are in need of transportation to get to and from your appointments in our office.  You can reach Transportation Services by calling [phone] Monday - Friday  7am-6pm.  Hysteroscopy Hysteroscopy is a procedure that is used to examine the inside of a woman's womb (uterus). This may be done for various reasons, including:  To look for lumps (tumors) and other growths in the uterus.  To evaluate abnormal bleeding, fibroid tumors, polyps, scar tissue (adhesions), or cancer of the uterus.  To determine the cause of an inability to get pregnant (infertility) or repeated losses of pregnancies (miscarriages).  To find a lost IUD (intrauterine device).  To perform a procedure that permanently prevents pregnancy (sterilization). During this procedure, a thin, flexible tube with a small light and camera (hysteroscope) is used to examine the uterus. The camera sends images to a monitor in the room so that your health care provider can view the inside of your uterus. A hysteroscopy should be done right after a menstrual period to make sure that you are not pregnant. Tell a health care provider about:  Any allergies you have.  All medicines you are taking, including vitamins, herbs, eye drops, creams, and over-the-counter medicines.  Any problems you or family members have had with the use of anesthetic medicines.  Any blood disorders you have.  Any surgeries you have had.  Any medical conditions you have.  Whether you are pregnant or may be pregnant. What are the risks? Generally, this is a safe procedure. However, problems may occur, including:  Excessive bleeding.  Infection.  Damage to the uterus or other structures or organs.  Allergic reaction to medicines or fluids that are used in the procedure. What happens before the procedure? Staying hydrated Follow instructions from your health care provider about hydration, which may include:  Up to 2 hours before the procedure -  you may continue to drink clear liquids, such as water, clear fruit juice, black coffee, and plain tea. Eating and drinking restrictions Follow instructions from your health care provider about eating and drinking, which may include:  8 hours before the procedure - stop eating solid foods and drink clear liquids only  2 hours before the procedure - stop drinking clear liquids. General instructions  Ask your health care provider about: ? Changing or stopping your normal medicines. This is important if you take diabetes medicines or blood thinners. ? Taking medicines such as aspirin and ibuprofen. These medicines can thin your blood and cause bleeding. Do not take these medicines for 1 week before your procedure, or as told by your health care provider.  Do not use any products that contain nicotine or tobacco for 2 weeks before the procedure. This includes cigarettes and e-cigarettes. If you need help quitting, ask your health care provider.  Medicine may be placed in your cervix the day before the procedure. This medicine causes the cervix to have a larger opening (dilate). The larger opening makes it easier for the hysteroscope to be inserted into the uterus during the procedure.  Plan to have someone with you for the first 24-48 hours after the procedure, especially if you are given a medicine to make you fall asleep (general anesthetic).  Plan to have someone take you home from the hospital or clinic. What happens during the procedure?  To lower your risk of infection: ? Your health care team will wash or sanitize their hands. ? Your skin will be washed with soap. ? Hair may be removed  from the surgical area.  An IV tube will be inserted into one of your veins.  You may be given one or more of the following: ? A medicine to help you relax (sedative). ? A medicine that numbs the area around the cervix (local anesthetic). ? A medicine to make you fall asleep (general  anesthetic).  A hysteroscope will be inserted through your vagina and into your uterus.  Air or fluid will be used to enlarge your uterus, enabling your health care provider to see your uterus better. The amount of fluid used will be carefully checked throughout the procedure.  In some cases, tissue may be gently scraped from inside the uterus and sent to a lab for testing (biopsy). The procedure may vary among health care providers and hospitals. What happens after the procedure?  Your blood pressure, heart rate, breathing rate, and blood oxygen level will be monitored until the medicines you were given have worn off.  You may have some cramping. You may be given medicines for this.  You may have bleeding, which varies from light spotting to menstrual-like bleeding. This is normal.  If you had a biopsy done, it is your responsibility to get the results of your procedure. Ask your health care provider, or the department performing the procedure, when your results will be ready. Summary  Hysteroscopy is a procedure that is used to examine the inside of a woman's womb (uterus).  After the procedure, you may have bleeding, which varies from light spotting to menstrual-like bleeding. This is normal. You may also have cramping.  Plan to have someone take you home from the hospital or clinic. This information is not intended to replace advice given to you by your health care provider. Make sure you discuss any questions you have with your health care provider. Document Revised: 02/11/2017 Document Reviewed: 03/30/2016 Elsevier Patient Education  [place].

## 2019-12-14 NOTE — Progress Notes (Signed)
GYNECOLOGY OFFICE VISIT NOTE  History:   Brenda Sweeney is a 49 y.o. [id number] here today for evaluation and management of prolapsing large endometrial polyp/fibroid.  Was seen in the [facility] recently. Still has ongoing mild-moderate bleeding, but feels pressure from the mass. She denies any other concerns.    Past Medical History:  Diagnosis Date  . Medical history non-contributory     Past Surgical History:  Procedure Laterality Date  . TUBAL LIGATION      The following portions of the patient's history were reviewed and updated as appropriate: allergies, current medications, past family history, past medical history, past social history, past surgical history and problem list.   Health Maintenance:  No recent pap smears   Review of Systems:  Pertinent items noted in HPI and remainder of comprehensive ROS otherwise negative.  Physical Exam:  BP 108/72   Pulse 97   Ht 5\' 5"  (1.651 m)   Wt 179 lb (81.2 kg)   LMP 10/14/2019   BMI 29.79 kg/m  CONSTITUTIONAL: Well-developed, well-nourished female in no acute distress.  HEENT:  Normocephalic, atraumatic. External right and left ear normal. No scleral icterus.  NECK: Normal range of motion, supple, no masses noted on observation SKIN: No rash noted. Not diaphoretic. No erythema. No pallor. MUSCULOSKELETAL: Normal range of motion. No edema noted. NEUROLOGIC: Alert and oriented to person, place, and time. Normal muscle tone coordination. No cranial nerve deficit noted. PSYCHIATRIC: Normal mood and affect. Normal behavior. Normal judgment and thought content. CARDIOVASCULAR: Normal heart rate noted RESPIRATORY: Effort and breath sounds normal, no problems with respiration noted ABDOMEN: No masses noted. No other overt distention noted.   PELVIC: Large 5 cm mass, hypervascular protruding from cervix, unable to palpate stalk.  Unable to visualize cervix. Tender to touch. Exam performed in the presence of a  chaperone.  Labs and Imaging Results for orders placed or performed during the hospital encounter of 12/12/19 (from the past 168 hour(s))  Urinalysis, Routine w reflex microscopic Urine, Clean Catch   Collection Time: 12/12/19 11:35 AM  Result Value Ref Range   Color, Urine YELLOW YELLOW   APPearance HAZY (A) CLEAR   Specific Gravity, Urine 1.009 1.005 - 1.030   pH 5.0 5.0 - 8.0   Glucose, UA NEGATIVE NEGATIVE mg/dL   Hgb urine dipstick LARGE (A) NEGATIVE   Bilirubin Urine NEGATIVE NEGATIVE   Ketones, ur NEGATIVE NEGATIVE mg/dL   Protein, ur 30 (A) NEGATIVE mg/dL   Nitrite NEGATIVE NEGATIVE   Leukocytes,Ua LARGE (A) NEGATIVE   RBC / HPF >50 (H) 0 - 5 RBC/hpf   WBC, UA >50 (H) 0 - 5 WBC/hpf   Bacteria, UA RARE (A) NONE SEEN   Squamous Epithelial / LPF 0-5 0 - 5   Mucus PRESENT    DG Chest 2 View  Result Date: 10/06/2019 CLINICAL DATA:  49 year old female with history of left lateral flank and chest pain. EXAM: CHEST - 2 VIEW COMPARISON:  No priors. FINDINGS: Lung volumes are normal. No consolidative airspace disease. No pleural effusions. No pneumothorax. No pulmonary nodule or mass noted. Pulmonary vasculature and the cardiomediastinal silhouette are within normal limits. IMPRESSION: No radiographic evidence of acute cardiopulmonary disease. Electronically Signed   By: Vinnie Langton M.D.   On: 10/06/2019 10:32   CT ABDOMEN PELVIS W CONTRAST  Result Date: 10/06/2019 CLINICAL DATA:  49 year old female with history of left lateral flank pain for 1 month and right lateral hip pain for 1 month. EXAM: CT ABDOMEN AND  PELVIS WITH CONTRAST TECHNIQUE: Multidetector CT imaging of the abdomen and pelvis was performed using the standard protocol following bolus administration of intravenous contrast. CONTRAST:  176mL OMNIPAQUE IOHEXOL 300 MG/ML  SOLN COMPARISON:  No priors. FINDINGS: Lower chest: Mild linear scarring in the left lower lobe. Hepatobiliary: Subcentimeter low-attenuation lesions  between segments 2 and 3 of the liver and in segment 5 of the liver, too small to definitively characterize, but favored to represent tiny cysts and/or biliary hamartomas. No other suspicious hepatic lesions. No intra or extrahepatic biliary ductal dilatation. Gallbladder is normal in appearance. Pancreas: No pancreatic mass. No pancreatic ductal dilatation. No pancreatic or peripancreatic fluid collections or inflammatory changes. Spleen: Unremarkable. Adrenals/Urinary Tract: Bilateral kidneys and adrenal glands are normal in appearance. No hydroureteronephrosis. Urinary bladder is normal in appearance. Stomach/Bowel: The appearance of the stomach is normal. No pathologic dilatation of small bowel or colon. Normal appendix. Vascular/Lymphatic: No significant atherosclerotic disease, aneurysm or dissection noted in the abdominal or pelvic vasculature no lymphadenopathy noted in the abdomen or pelvis. Reproductive: There is a hypervascular lesion which appears pedunculated extending from the endometrial canal into the lower uterine segment likely partially prolapsing out of the cervical os (axial image 80 of series 3 and sagittal image 63 of series 7) estimated to measure approximately 5.8 x 2.5 x 3.5 cm, likely to represent a large endometrial polyp. Ovaries are unremarkable in appearance. Other: Trace volume of free fluid in the cul-de-sac likely physiologic. No larger volume of ascites. No pneumoperitoneum. Musculoskeletal: There are no aggressive appearing lytic or blastic lesions noted in the visualized portions of the skeleton. IMPRESSION: 1. No acute findings are noted in the abdomen or pelvis to account for the patient's symptoms. 2. Large lesion in the uterus suspicious for prolapsing endometrial polyp. Further evaluation with pelvic ultrasound is recommended. 3. Small volume of free fluid in the cul-de-sac, presumably physiologic. 4. Additional incidental findings, as above. Electronically Signed   By:  Vinnie Langton M.D.   On: 10/06/2019 10:32       Assessment and Plan:      1. Prolapsed endometrial mass 2. Abnormal uterine bleeding (AUB) Given patient's symptoms, counseled about need for surgical removal in the operating room. Also concerned about vascular nature of mass and being able to cauterize the base of the stalk after removal.  Recommended hysteroscopy, dilation and curettage, removal of endometrial mass, possible myomectomy.  This was scheduled on 12/19/2019 at 2 pm, preoperative instructions given to patient.  The risks of surgery were discussed in detail with the patient including but not limited to: bleeding; infection; injury to surrounding organs; formation of adhesions; need for additional procedures or subsequent procedures secondary to abnormal pathology; and other postoperative or anesthesia complications.  Patient was told that the likelihood that her condition and symptoms will be treated effectively with this surgical management was very high. All questions were answered.   She is aware of need for preoperative COVID testing and subsequent quarantine from time of test to time of surgery; she will be given further preoperative instructions by the preoperative team.  Printed patient education handouts about the procedure were given to the patient to review at home.   Please refer to After Visit Summary for other counseling recommendations.   Return in about 3 weeks (around 01/04/2020) for Postoperative follow up after surgery on 12/19/19.    Total face-to-face time with patient: 20 minutes.  Over 50% of encounter was spent on counseling and coordination of care.   Virginia Francisco  Harolyn Rutherford, MD, [place], [facility] for [employer], [place]

## 2019-12-17 ENCOUNTER — Other Ambulatory Visit (HOSPITAL_COMMUNITY)
Admission: RE | Admit: 2019-12-17 | Discharge: 2019-12-17 | Disposition: A | Discharge status: Home / Self Care | Payer: Self-pay | Source: Ambulatory Visit | Attending: Obstetrics & Gynecology | Admitting: Obstetrics & Gynecology

## 2019-12-17 DIAGNOSIS — Z20822 Contact with and (suspected) exposure to covid-19: Secondary | ICD-10-CM | POA: Insufficient documentation

## 2019-12-17 DIAGNOSIS — Z01812 Encounter for preprocedural laboratory examination: Secondary | ICD-10-CM | POA: Insufficient documentation

## 2019-12-17 LAB — SARS CORONAVIRUS 2 (TAT 6-24 HRS): SARS Coronavirus 2: NEGATIVE

## 2019-12-19 ENCOUNTER — Other Ambulatory Visit: Payer: Self-pay

## 2019-12-19 ENCOUNTER — Encounter (HOSPITAL_BASED_OUTPATIENT_CLINIC_OR_DEPARTMENT_OTHER)
Admission: RE | Disposition: A | Discharge status: Home / Self Care | Payer: Self-pay | Source: Home / Self Care | Attending: Obstetrics & Gynecology

## 2019-12-19 ENCOUNTER — Encounter (HOSPITAL_BASED_OUTPATIENT_CLINIC_OR_DEPARTMENT_OTHER): Payer: Self-pay | Admitting: Obstetrics & Gynecology

## 2019-12-19 ENCOUNTER — Ambulatory Visit (HOSPITAL_BASED_OUTPATIENT_CLINIC_OR_DEPARTMENT_OTHER)
Admission: RE | Admit: 2019-12-19 | Discharge: 2019-12-19 | Disposition: A | Discharge status: Home / Self Care | Payer: Self-pay | Attending: Obstetrics & Gynecology | Admitting: Obstetrics & Gynecology

## 2019-12-19 ENCOUNTER — Ambulatory Visit (HOSPITAL_BASED_OUTPATIENT_CLINIC_OR_DEPARTMENT_OTHER): Payer: Self-pay | Admitting: Anesthesiology

## 2019-12-19 DIAGNOSIS — E669 Obesity, unspecified: Secondary | ICD-10-CM | POA: Insufficient documentation

## 2019-12-19 DIAGNOSIS — D259 Leiomyoma of uterus, unspecified: Secondary | ICD-10-CM | POA: Insufficient documentation

## 2019-12-19 DIAGNOSIS — Z6830 Body mass index (BMI) 30.0-30.9, adult: Secondary | ICD-10-CM | POA: Insufficient documentation

## 2019-12-19 DIAGNOSIS — F17200 Nicotine dependence, unspecified, uncomplicated: Secondary | ICD-10-CM | POA: Insufficient documentation

## 2019-12-19 DIAGNOSIS — N9489 Other specified conditions associated with female genital organs and menstrual cycle: Secondary | ICD-10-CM

## 2019-12-19 DIAGNOSIS — N939 Abnormal uterine and vaginal bleeding, unspecified: Secondary | ICD-10-CM | POA: Insufficient documentation

## 2019-12-19 HISTORY — PX: DILATATION & CURETTAGE/HYSTEROSCOPY WITH MYOSURE: SHX6511

## 2019-12-19 LAB — POCT PREGNANCY, URINE: Preg Test, Ur: NEGATIVE

## 2019-12-19 LAB — CBC
HCT: 29.2 % — ABNORMAL LOW (ref 36.0–46.0)
Hemoglobin: 8.6 g/dL — ABNORMAL LOW (ref 12.0–15.0)
MCH: 22.4 pg — ABNORMAL LOW (ref 26.0–34.0)
MCHC: 29.5 g/dL — ABNORMAL LOW (ref 30.0–36.0)
MCV: 76 fL — ABNORMAL LOW (ref 80.0–100.0)
Platelets: 462 10*3/uL — ABNORMAL HIGH (ref 150–400)
RBC: 3.84 MIL/uL — ABNORMAL LOW (ref 3.87–5.11)
RDW: 19.3 % — ABNORMAL HIGH (ref 11.5–15.5)
WBC: 10.4 10*3/uL (ref 4.0–10.5)
nRBC: 0 % (ref 0.0–0.2)

## 2019-12-19 SURGERY — DILATATION & CURETTAGE/HYSTEROSCOPY WITH MYOSURE
Anesthesia: General | Site: Uterus

## 2019-12-19 MED ORDER — DOXYCYCLINE HYCLATE 100 MG PO TABS
200.0000 mg | ORAL_TABLET | Freq: Once | ORAL | Status: AC
  Administered 2019-12-19: 200 mg via ORAL

## 2019-12-19 MED ORDER — BUPIVACAINE HCL 0.5 % IJ SOLN
INTRAMUSCULAR | Status: DC | PRN
  Administered 2019-12-19: 30 mL

## 2019-12-19 MED ORDER — DEXAMETHASONE SODIUM PHOSPHATE 10 MG/ML IJ SOLN
INTRAMUSCULAR | Status: AC
  Filled 2019-12-19: qty 1

## 2019-12-19 MED ORDER — OXYCODONE HCL 5 MG/5ML PO SOLN: 5.0000 mg | Freq: Once | ORAL | Status: DC | PRN

## 2019-12-19 MED ORDER — POVIDONE-IODINE 10 % EX SWAB
2.0000 "application " | Freq: Once | CUTANEOUS | Status: AC
  Administered 2019-12-19: 2 via TOPICAL

## 2019-12-19 MED ORDER — SODIUM CHLORIDE 0.9 % IR SOLN
Status: DC | PRN
  Administered 2019-12-19: 3000 mL

## 2019-12-19 MED ORDER — IBUPROFEN 600 MG PO TABS: 600.0000 mg | ORAL_TABLET | Freq: Four times a day (QID) | ORAL | 2 refills | Status: DC | PRN

## 2019-12-19 MED ORDER — DEXAMETHASONE SODIUM PHOSPHATE 4 MG/ML IJ SOLN
INTRAMUSCULAR | Status: DC | PRN
  Administered 2019-12-19: 10 mg via INTRAVENOUS

## 2019-12-19 MED ORDER — KETOROLAC TROMETHAMINE 30 MG/ML IJ SOLN
INTRAMUSCULAR | Status: AC
  Filled 2019-12-19: qty 1

## 2019-12-19 MED ORDER — KETOROLAC TROMETHAMINE 30 MG/ML IJ SOLN
INTRAMUSCULAR | Status: DC | PRN
  Administered 2019-12-19: 30 mg via INTRAVENOUS

## 2019-12-19 MED ORDER — LACTATED RINGERS IV SOLN: INTRAVENOUS | Status: DC

## 2019-12-19 MED ORDER — DOXYCYCLINE HYCLATE 100 MG PO TABS
ORAL_TABLET | ORAL | Status: AC
  Filled 2019-12-19: qty 2

## 2019-12-19 MED ORDER — PROMETHAZINE HCL 25 MG/ML IJ SOLN: 6.2500 mg | INTRAMUSCULAR | Status: DC | PRN

## 2019-12-19 MED ORDER — FENTANYL CITRATE (PF) 100 MCG/2ML IJ SOLN: 25.0000 ug | INTRAMUSCULAR | Status: DC | PRN

## 2019-12-19 MED ORDER — MIDAZOLAM HCL 5 MG/5ML IJ SOLN
INTRAMUSCULAR | Status: DC | PRN
  Administered 2019-12-19: 2 mg via INTRAVENOUS

## 2019-12-19 MED ORDER — FENTANYL CITRATE (PF) 100 MCG/2ML IJ SOLN
INTRAMUSCULAR | Status: AC
  Filled 2019-12-19: qty 2

## 2019-12-19 MED ORDER — LIDOCAINE 2% (20 MG/ML) 5 ML SYRINGE
INTRAMUSCULAR | Status: DC | PRN
  Administered 2019-12-19: 60 mg via INTRAVENOUS

## 2019-12-19 MED ORDER — ONDANSETRON HCL 4 MG/2ML IJ SOLN
INTRAMUSCULAR | Status: DC | PRN
  Administered 2019-12-19: 4 mg via INTRAVENOUS

## 2019-12-19 MED ORDER — OXYCODONE HCL 5 MG PO TABS: 5.0000 mg | ORAL_TABLET | ORAL | 0 refills | Status: DC | PRN

## 2019-12-19 MED ORDER — DOCUSATE SODIUM 100 MG PO CAPS: 100.0000 mg | ORAL_CAPSULE | Freq: Two times a day (BID) | ORAL | 2 refills | Status: DC | PRN

## 2019-12-19 MED ORDER — FENTANYL CITRATE (PF) 100 MCG/2ML IJ SOLN
INTRAMUSCULAR | Status: DC | PRN
  Administered 2019-12-19 (×4): 50 ug via INTRAVENOUS

## 2019-12-19 MED ORDER — OXYCODONE HCL 5 MG PO TABS: 5.0000 mg | ORAL_TABLET | Freq: Once | ORAL | Status: DC | PRN

## 2019-12-19 MED ORDER — PHENYLEPHRINE 40 MCG/ML (10ML) SYRINGE FOR IV PUSH (FOR BLOOD PRESSURE SUPPORT)
PREFILLED_SYRINGE | INTRAVENOUS | Status: AC
  Filled 2019-12-19: qty 10

## 2019-12-19 MED ORDER — PROPOFOL 10 MG/ML IV BOLUS
INTRAVENOUS | Status: DC | PRN
  Administered 2019-12-19: 150 mg via INTRAVENOUS

## 2019-12-19 MED ORDER — MIDAZOLAM HCL 2 MG/2ML IJ SOLN
INTRAMUSCULAR | Status: AC
  Filled 2019-12-19: qty 2

## 2019-12-19 SURGICAL SUPPLY — 26 items
CANISTER SUCT 1200ML W/VALVE (MISCELLANEOUS) ×3 IMPLANT
CATH ROBINSON RED A/P 16FR (CATHETERS) ×3 IMPLANT
DEVICE MYOSURE LITE (MISCELLANEOUS) IMPLANT
DEVICE MYOSURE REACH (MISCELLANEOUS) IMPLANT
ELECT BLADE 4.0 EZ CLEAN MEGAD (MISCELLANEOUS) ×3
ELECTRODE BLDE 4.0 EZ CLN MEGD (MISCELLANEOUS) ×1 IMPLANT
GAUZE 4X4 16PLY RFD (DISPOSABLE) ×3 IMPLANT
GLOVE BIOGEL PI IND STRL 7.0 (GLOVE) ×2 IMPLANT
GLOVE BIOGEL PI INDICATOR 7.0 (GLOVE) ×4
GLOVE ECLIPSE 7.0 STRL STRAW (GLOVE) ×3 IMPLANT
GOWN STRL REUS W/TWL LRG LVL3 (GOWN DISPOSABLE) ×6 IMPLANT
KIT PROCEDURE FLUENT (KITS) ×3 IMPLANT
KIT TURNOVER KIT B (KITS) ×3 IMPLANT
MYOSURE XL FIBROID (MISCELLANEOUS) ×3
PACK VAGINAL MINOR WOMEN LF (CUSTOM PROCEDURE TRAY) ×3 IMPLANT
PAD OB MATERNITY 4.3X12.25 (PERSONAL CARE ITEMS) ×3 IMPLANT
PAD PREP 24X48 CUFFED NSTRL (MISCELLANEOUS) ×3 IMPLANT
PENCIL BUTTON HOLSTER BLD 10FT (ELECTRODE) ×3 IMPLANT
SEAL ROD LENS SCOPE MYOSURE (ABLATOR) ×3 IMPLANT
SLEEVE SCD COMPRESS KNEE MED (MISCELLANEOUS) ×3 IMPLANT
SUT VIC AB 0 CT1 27 (SUTURE) ×3
SUT VIC AB 0 CT1 27XBRD ANBCTR (SUTURE) ×1 IMPLANT
SUT VIC AB 3-0 CT1 27 (SUTURE) ×3
SUT VIC AB 3-0 CT1 TAPERPNT 27 (SUTURE) ×1 IMPLANT
SYSTEM TISS REMOVAL MYOSURE XL (MISCELLANEOUS) ×1 IMPLANT
TOWEL GREEN STERILE FF (TOWEL DISPOSABLE) ×6 IMPLANT

## 2019-12-19 NOTE — Transfer of Care (Signed)
Immediate Anesthesia Transfer of Care Note  Patient: Brenda Sweeney  Procedure(s) Performed: DILATATION & CURETTAGE, HYSTEROSCOPY, MYOMECTOMY WITH MYOSURE (N/A Uterus)  Patient Location: PACU  Anesthesia Type:General  Level of Consciousness: awake, alert  and oriented  Airway & Oxygen Therapy: Patient Spontanous Breathing and Patient connected to face mask oxygen  Post-op Assessment: Report given to RN and Post -op Vital signs reviewed and stable  Post vital signs: Reviewed and stable  Last Vitals:  Vitals Value Taken Time  BP 117/61 12/19/19 1500  Temp    Pulse 61 12/19/19 1500  Resp 15 12/19/19 1500  SpO2 100 % 12/19/19 1500  Vitals shown include unvalidated device data.  Last Pain:  Vitals:   12/19/19 1314  PainSc: 0-No pain         Complications: No complications documented.

## 2019-12-19 NOTE — Interval H&P Note (Signed)
History and Physical Interval Note 12/19/2019 12:59 PM  Brenda Sweeney  has presented today for surgery, with the diagnosis of AUB, Prolapsing Endometrial Mass.  The various methods of treatment have been discussed with the patient and family. After consideration of risks, benefits and other options for treatment, the patient has consented to  Procedure(s): DILATATION & CURETTAGE/HYSTEROSCOPY WITH POSSIBLE MYOMECTOMY/POLYPECTOMY, POSSIBLE MYOSURE USE (N/A) as a surgical intervention.  The patient's history has been reviewed, patient examined, no change in status, stable for surgery.  I have reviewed the patient's chart and labs.  Questions were answered to the patient's satisfaction.  To OR when ready.   Verita Schneiders, MD, [place] for [employer], [place]

## 2019-12-19 NOTE — Anesthesia Preprocedure Evaluation (Addendum)
Anesthesia Evaluation  Patient identified by MRN, date of birth, ID band Patient awake    Reviewed: Allergy & Precautions, NPO status , Patient's Chart, lab work & pertinent test results  History of Anesthesia Complications Negative for: history of anesthetic complications  Airway Mallampati: II  TM Distance: >3 FB Neck ROM: Full    Dental  (+) Dental Advisory Given, Chipped,    Pulmonary Current Smoker and Patient abstained from smoking.,    Pulmonary exam normal        Cardiovascular negative cardio ROS Normal cardiovascular exam     Neuro/Psych negative neurological ROS  negative psych ROS   GI/Hepatic negative GI ROS, Neg liver ROS,   Endo/Other   Obesity   Renal/GU negative Renal ROS     Musculoskeletal negative musculoskeletal ROS (+)   Abdominal   Peds  Hematology negative hematology ROS (+)   Anesthesia Other Findings Covid test negative   Reproductive/Obstetrics  S/p tubal ligation                             Anesthesia Physical Anesthesia Plan  ASA: II  Anesthesia Plan: General   Post-op Pain Management:    Induction: Intravenous  PONV Risk Score and Plan: 3 and Treatment may vary due to age or medical condition, Ondansetron, Dexamethasone and Midazolam  Airway Management Planned: LMA  Additional Equipment: None  Intra-op Plan:   Post-operative Plan: Extubation in OR  Informed Consent: I have reviewed the patients History and Physical, chart, labs and discussed the procedure including the risks, benefits and alternatives for the proposed anesthesia with the patient or authorized representative who has indicated his/her understanding and acceptance.     Dental advisory given  Plan Discussed with: CRNA and Anesthesiologist  Anesthesia Plan Comments:        Anesthesia Quick Evaluation

## 2019-12-19 NOTE — Op Note (Signed)
PREOPERATIVE DIAGNOSES:  Abnormal uterine bleeding, prolapsing endometrial mass POSTOPERATIVE DIAGNOSES: The same PROCEDURE: Hysteroscopy, Dilation and Curettage. Myomectomy with Myosure. SURGEON:  Dr. Verita Schneiders   INDICATIONS: 49 y.o. [id number] here for scheduled surgery for the aforementioned diagnoses.   Risks of surgery were discussed with the patient including but not limited to: bleeding which may require transfusion; infection which may require antibiotics; injury to uterus or surrounding organs; intrauterine scarring which may impair future fertility; need for additional procedures including laparotomy or laparoscopy; and other postoperative/anesthesia complications. Written informed consent was obtained.    FINDINGS:  A nine week size uterus.  5 cm x 4 cm hard, smooth, vascular mass protruding through cervical canal.  Stalk was noted to be originating from anterior right fundal region.  Diffuse proliferative endometrium.  Normal ostia bilaterally.  ANESTHESIA:   General, paracervical block with 30 ml of 0.5% Marcaine ESTIMATED BLOOD LOSS:  100 ml SPECIMENS: Endometrial mass and curettings sent to pathology COMPLICATIONS:  None immediate.  PROCEDURE DETAILS:  The patient was then taken to the operating room where general anesthesia was administered and was found to be adequate.  After an adequate timeout was performed, she was placed in the dorsal lithotomy position and examined; then prepped and draped in the sterile manner.   Her bladder was catheterized for an unmeasured amount of clear, yellow urine. A speculum was then placed in the patient's vagina.  The large prolapsing mass was recognized and an attempt was made to palpate the stalk but it was high up in the endometrial canal.  The accessible part of the stalk was cut using electrocautery, freeing the mass.  A single tooth tenaculum was applied to the anterior lip of the cervix.   The hysteroscope was then inserted under direct  visualization using NS as a suspension medium.  The uterine cavity was carefully examined with the findings as noted above, the stalk of the endometrial mass was able to be fully visualized.   After further careful visualization of the uterine cavity, the Myosure XL device was used to excise the stalk from its origination point.   The hysteroscope and Myosure were then removed under direct visualization.  A sharp curettage was then performed to obtain a moderate amount of endometrial curettings.   A paracervical block using 30 ml of 0.5% Marcaine was administered.  The tenaculum was removed from the anterior lip of the cervix and the vaginal speculum was removed after noting good hemostasis.  The patient tolerated the procedure well and was taken to the recovery area awake, extubated and in stable condition.  The patient will be discharged to home as per PACU criteria.  Routine postoperative instructions given.  She was prescribed Oxycodone, Ibuprofen and Colace.  She will follow up in the office in about three weeks  for postoperative evaluation.   Verita Schneiders, MD, [place] for [employer], [place]

## 2019-12-19 NOTE — Anesthesia Postprocedure Evaluation (Signed)
Anesthesia Post Note  Patient: Brenda Sweeney  Procedure(s) Performed: DILATATION & CURETTAGE, HYSTEROSCOPY, MYOMECTOMY WITH MYOSURE (N/A Uterus)     Patient location during evaluation: PACU Anesthesia Type: General Level of consciousness: awake and alert Pain management: pain level controlled Vital Signs Assessment: post-procedure vital signs reviewed and stable Respiratory status: spontaneous breathing, nonlabored ventilation and respiratory function stable Cardiovascular status: blood pressure returned to baseline and stable Postop Assessment: no apparent nausea or vomiting Anesthetic complications: no   No complications documented.  Last Vitals:  Vitals:   12/19/19 1500 12/19/19 1511  BP: 117/61 125/79  Pulse: (!) 58 65  Resp: 17 13  Temp:    SpO2: 100% 100%    Last Pain:  Vitals:   12/19/19 1511  PainSc: 0-No pain                 [name]

## 2019-12-19 NOTE — Anesthesia Procedure Notes (Signed)
Procedure Name: LMA Insertion Date/Time: 12/19/2019 2:05 PM Performed by: Lieutenant Diego, CRNA Pre-anesthesia Checklist: Patient identified, Emergency Drugs available, Suction available and Patient being monitored Patient Re-evaluated:Patient Re-evaluated prior to induction Oxygen Delivery Method: Circle system utilized Preoxygenation: Pre-oxygenation with 100% oxygen Induction Type: IV induction Ventilation: Mask ventilation without difficulty LMA: LMA inserted LMA Size: 4.0 Number of attempts: 1 Placement Confirmation: positive ETCO2 and breath sounds checked- equal and bilateral Tube secured with: Tape Dental Injury: Teeth and Oropharynx as per pre-operative assessment

## 2019-12-19 NOTE — Discharge Instructions (Addendum)
Hysteroscopy, Care After This sheet gives you information about how to care for yourself after your procedure. Your health care provider may also give you more specific instructions. If you have problems or questions, contact your health care provider. What can I expect after the procedure? After the procedure, it is common to have:  Cramping.  Bleeding. This can vary from light spotting to menstrual-like bleeding. Follow these instructions at home: Activity  Rest for 1-2 days after the procedure.  Do not douche, use tampons, or have sex for 2 weeks after the procedure, or until your health care provider approves.  Do not drive for 24 hours after the procedure, or for as long as told by your health care provider.  Do not drive, use heavy machinery, or drink alcohol while taking prescription pain medicines. Medicines   Take over-the-counter and prescription medicines only as told by your health care provider.  Do not take aspirin during recovery. It can increase the risk of bleeding. General instructions  Do not take baths, swim, or use a hot tub until your health care provider approves. Take showers instead of baths for 2 weeks, or for as long as told by your health care provider.  To prevent or treat constipation while you are taking prescription pain medicine, your health care provider may recommend that you: ? Drink enough fluid to keep your urine clear or pale yellow. ? Take over-the-counter or prescription medicines. ? Eat foods that are high in fiber, such as fresh fruits and vegetables, whole grains, and beans. ? Limit foods that are high in fat and processed sugars, such as fried and sweet foods.  Keep all follow-up visits as told by your health care provider. This is important. Contact a health care provider if:  You feel dizzy or lightheaded.  You feel nauseous.  You have abnormal vaginal discharge.  You have a rash.  You have pain that does not get better with  medicine.  You have chills. Get help right away if:  You have bleeding that is heavier than a normal menstrual period.  You have a fever.  You have pain or cramps that get worse.  You develop new abdominal pain.  You faint.  You have pain in your shoulders.  You have shortness of breath. Summary  After the procedure, you may have cramping and some vaginal bleeding.  Do not douche, use tampons, or have sex for 2 weeks after the procedure, or until your health care provider approves.  Do not take baths, swim, or use a hot tub until your health care provider approves. Take showers instead of baths for 2 weeks, or for as long as told by your health care provider.  Report any unusual symptoms to your health care provider.  Keep all follow-up visits as told by your health care provider. This is important. This information is not intended to replace advice given to you by your health care provider. Make sure you discuss any questions you have with your health care provider. Document Revised: 02/11/2017 Document Reviewed: 03/30/2016 Elsevier Patient Education  2020 [employer]  Post Anesthesia Home Care Instructions  Activity: Get plenty of rest for the remainder of the day. A responsible individual must stay with you for 24 hours following the procedure.  For the next 24 hours, DO NOT: -Drive a car -Operate machinery -Drink alcoholic beverages -Take any medication unless instructed by your physician -Make any legal decisions or sign important papers.  Meals: Start with liquid foods such   as gelatin or soup. Progress to regular foods as tolerated. Avoid greasy, spicy, heavy foods. If nausea and/or vomiting occur, drink only clear liquids until the nausea and/or vomiting subsides. Call your physician if vomiting continues.  Special Instructions/Symptoms: Your throat may feel dry or sore from the anesthesia or the breathing tube placed in your throat during surgery. If this  causes discomfort, gargle with warm salt water. The discomfort should disappear within 24 hours.  If you had a scopolamine patch placed behind your ear for the management of post- operative nausea and/or vomiting:  1. The medication in the patch is effective for 72 hours, after which it should be removed.  Wrap patch in a tissue and discard in the trash. Wash hands thoroughly with soap and water. 2. You may remove the patch earlier than 72 hours if you experience unpleasant side effects which may include dry mouth, dizziness or visual disturbances. 3. Avoid touching the patch. Wash your hands with soap and water after contact with the patch.     

## 2019-12-20 ENCOUNTER — Encounter (HOSPITAL_BASED_OUTPATIENT_CLINIC_OR_DEPARTMENT_OTHER): Payer: Self-pay | Admitting: Obstetrics & Gynecology

## 2019-12-20 LAB — SURGICAL PATHOLOGY

## 2020-01-07 ENCOUNTER — Other Ambulatory Visit: Payer: Self-pay

## 2020-01-07 ENCOUNTER — Ambulatory Visit (INDEPENDENT_AMBULATORY_CARE_PROVIDER_SITE_OTHER): Payer: Self-pay | Admitting: Obstetrics & Gynecology

## 2020-01-07 ENCOUNTER — Encounter: Payer: Self-pay | Admitting: Obstetrics & Gynecology

## 2020-01-07 VITALS — BP 103/69 | HR 73 | Ht 65.0 in | Wt 182.0 lb

## 2020-01-07 DIAGNOSIS — Z0000 Encounter for general adult medical examination without abnormal findings: Secondary | ICD-10-CM

## 2020-01-07 DIAGNOSIS — Z09 Encounter for follow-up examination after completed treatment for conditions other than malignant neoplasm: Secondary | ICD-10-CM

## 2020-01-07 DIAGNOSIS — N9489 Other specified conditions associated with female genital organs and menstrual cycle: Secondary | ICD-10-CM

## 2020-01-07 NOTE — Progress Notes (Signed)
    Postoperative Visit  Subjective:     Brenda Sweeney is a 49 y.o. female who presents to the clinic 2 weeks status post Hysteroscopy, Dilation and Curettage, Vaginal Myomectomy for prolapsed submucosal fibroid. Eating a regular diet without difficulty. Bowel movements are normal. The patient is not having any pain. No bleeding since procedure.  The following portions of the patient's history were reviewed and updated as appropriate: allergies, current medications, past family history, past medical history, past social history, past surgical history and problem list.  Review of Systems Pertinent items noted in HPI and remainder of comprehensive ROS otherwise negative.    Objective:    BP 103/69   Pulse 73   Ht 5\' 5"  (1.651 m)   Wt 182 lb (82.6 kg)   LMP 12/19/2019   BMI 30.29 kg/m  General:  alert and no distress  Abdomen: soft,  non-tender  Pelvic:   deferred   12/19/2019 Surgical Pathology A. 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 ENDOMETRIUM, CURETTAGE:  - Endometrium with stromal and glandular breakdown  - Polypoid fragments of benign smooth muscle with secretory pattern  endometrium  - Benign squamous epithelium  - No malignancy identified     Assessment:    Doing well postoperatively. Operative findings again reviewed. Pathology report discussed.    Plan:     1. Continue any current medications. 2.  Activity restrictions: none 3. Anticipated return to work: now.  Work paperwork to be filled out. 4. Referred to [facility] as she needs pap and mammogram. 5. Follow up: as needed for GYN concerns.     Verita Schneiders, MD, [place] for [employer], [place]

## 2020-01-09 ENCOUNTER — Other Ambulatory Visit: Payer: Self-pay | Admitting: Obstetrics and Gynecology

## 2020-01-09 DIAGNOSIS — Z1231 Encounter for screening mammogram for malignant neoplasm of breast: Secondary | ICD-10-CM

## 2020-01-10 ENCOUNTER — Encounter (HOSPITAL_BASED_OUTPATIENT_CLINIC_OR_DEPARTMENT_OTHER): Payer: Self-pay | Admitting: Obstetrics & Gynecology

## 2020-01-15 ENCOUNTER — Other Ambulatory Visit: Payer: Self-pay | Admitting: *Deleted

## 2020-01-15 ENCOUNTER — Ambulatory Visit: Payer: Self-pay | Admitting: *Deleted

## 2020-01-15 ENCOUNTER — Ambulatory Visit: Payer: Self-pay

## 2020-01-15 ENCOUNTER — Other Ambulatory Visit: Payer: Self-pay

## 2020-01-15 VITALS — BP 132/88 | Temp 97.3°F | Wt 186.2 lb

## 2020-01-15 DIAGNOSIS — Z1211 Encounter for screening for malignant neoplasm of colon: Secondary | ICD-10-CM

## 2020-01-15 DIAGNOSIS — Z01419 Encounter for gynecological examination (general) (routine) without abnormal findings: Secondary | ICD-10-CM

## 2020-01-15 DIAGNOSIS — N644 Mastodynia: Secondary | ICD-10-CM

## 2020-01-15 NOTE — Addendum Note (Signed)
Addended by: Royston Bake on: 01/15/2020 09:33 AM   Modules accepted: Orders

## 2020-01-15 NOTE — Progress Notes (Signed)
Ms. Brenda Sweeney is a 49 y.o. [id number] female who presents to [facility] clinic today with complaint of left breast lump x one year.    Pap Smear: Pap smear completed today. Last Pap smear was 4 years ago at a clinic in [place] and was normal per patient. Per patient has no history of an abnormal Pap smear. Last Pap smear result is not available in Epic.   Physical exam: Breasts Breasts symmetrical. No skin abnormalities bilateral breasts. No nipple retraction bilateral breasts. No nipple discharge bilateral breasts. No lymphadenopathy. No lumps palpated bilateral breasts. Unable to palpate a lump in patients area of concern within the left breast. Complaints of left upper inner quadrant breast pain on exam.       Pelvic/Bimanual Ext Genitalia No lesions, no swelling and no discharge observed on external genitalia.        Vagina Vagina pink and normal texture. No lesions or discharge observed in vagina.        Cervix Cervix is present. Cervix pink and of normal texture. No discharge observed.    Uterus Uterus is present and palpable. Uterus in normal position and normal size.        Adnexae Bilateral ovaries present and palpable. No tenderness on palpation.         Rectovaginal No rectal exam completed today since patient had no rectal complaints. No skin abnormalities observed on exam.     Smoking History: Patient is a current smoker. Discussed smoking cessation with patient. Referred patient to the [facility] Quitline and gave resources to the free smoking cessation classes at [facility].   Patient Navigation: Patient education provided. Access to services provided for patient through [place] program.   Colorectal Cancer Screening: Per patient has never had colonoscopy completed. FIT Test given to patient to complete. No complaints today.    Breast and Cervical Cancer Risk Assessment: Patient does not have family history of breast cancer, known genetic mutations, or  radiation treatment to the chest before age 22. Patient does not have history of cervical dysplasia, immunocompromised, or DES exposure in-utero.  Risk Assessment    Risk Scores      01/15/2020   Last edited by: Royston Bake, CMA   5-year risk: 0.8 %   Lifetime risk: 6.8 %          A: [facility] exam with pap smear Complaint of left breast lump.  P: Referred patient to the [facility] for a diagnostic mammogram. Appointment scheduled Tuesday, January 22, 2020 at 1010.  Loletta Parish, RN 01/15/2020 9:09 AM

## 2020-01-15 NOTE — Patient Instructions (Addendum)
Explained breast self awareness with Brenda Sweeney. Pap smear completed today. Let patient know that [facility] will cover Pap smears and HPV typing every 5 years if has no history of an abnormal Pap smear. Referred patient to the [facility] for a diagnostic mammogram. Appointment scheduled Tuesday, January 22, 2020 at 1010. Patient aware of appointment and will be there. Let patient know will follow up with her within the next couple weeks with results of her Pap smear by letter or phone. Discussed smoking cessation with patient. Referred patient to the [facility] Quitline and gave resources to the free smoking cessation classes at [facility]. Brenda Sweeney verbalized understanding.  Sylina Henion, Arvil Chaco, RN 9:09 AM

## 2020-01-17 LAB — CYTOLOGY - PAP
Comment: NEGATIVE
Diagnosis: NEGATIVE
High risk HPV: NEGATIVE

## 2020-01-18 ENCOUNTER — Telehealth: Payer: Self-pay

## 2020-01-18 MED ORDER — METRONIDAZOLE 500 MG PO TABS: 500.0000 mg | ORAL_TABLET | Freq: Two times a day (BID) | ORAL | 0 refills | Status: DC

## 2020-01-18 NOTE — Addendum Note (Signed)
Addended by: Mora Bellman on: 01/18/2020 10:53 AM   Modules accepted: Orders

## 2020-01-18 NOTE — Telephone Encounter (Addendum)
2nd attempt to contact patient, left message on voicemail requesting return call.   Left message on voicemail requesting patient to return call to office. (Attempted to contact patient regarding lab (pap smear) results).

## 2020-01-21 ENCOUNTER — Ambulatory Visit: Payer: Self-pay

## 2020-01-21 ENCOUNTER — Inpatient Hospital Stay: Payer: Self-pay | Attending: Obstetrics and Gynecology | Admitting: *Deleted

## 2020-01-21 ENCOUNTER — Other Ambulatory Visit: Payer: Self-pay

## 2020-01-21 ENCOUNTER — Telehealth: Payer: Self-pay

## 2020-01-21 VITALS — BP 110/78 | Ht 65.0 in | Wt 183.9 lb

## 2020-01-21 DIAGNOSIS — Z0000 Encounter for general adult medical examination without abnormal findings: Secondary | ICD-10-CM

## 2020-01-21 NOTE — Telephone Encounter (Signed)
Left message on voicemail requesting patient to return call. (Attempted to contact patient regarding lab/Pap test results.)

## 2020-01-21 NOTE — Progress Notes (Signed)
[facility] initial screening     Clinical Measurement:  Vitals:   01/21/20 1120  BP: 116/72   Fasting Labs Drawn Today, will review with patient when they result.   Medical History:  Patient states that she does not have high cholesterol, does not have high blood pressure and she does not have diabetes.  Medications:  Patient states that she does not take medication to lower cholesterol, blood pressure and blood sugar.  Patient does not take an aspirin a day to help prevent a heart attack or stroke.    Blood pressure, self measurement: Patient states that she does not measure blood pressure from home. She checks her blood pressure N/A. She shares her readings with a health care provider: N/A.   Nutrition: Patient states that on average she eats 3 cups of fruit and 3 cups of vegetables per day. Patient states that she does not eat fish at least 2 times per week. Patient eats less than half servings of whole grains. Patient drinks less than 36 ounces of beverages with added sugar weekly: yes. Patient is currently watching sodium or salt intake: no. In the past 7 days patient has had 0 drinks containing alcohol. On average patient drinks 0 drinks containing alcohol per day.      Physical activity:  Patient states that she gets 120 minutes of moderate and 0 minutes of vigorous physical activity each week.  Smoking status:  Patient states that she has is a current smoker .   Quality of life:  Over the past 2 weeks patient states that she had little interest or pleasure in doing things: not at all. She has been feeling down, depressed or hopeless:not at all.    Risk reduction and counseling:   Smoking Cessation: Patient stated that she is ready to quit smoking. Placed referral to the [facility] Quitline for patient.  Health Coaching: Spoke with patient about the importance of a heart healthy diet. Patient currently consumed recommended amount of fruits and vegetables daily, encouraged her to continue  doing so. Spoke with patient about heart healthy fish and benefits of consuming them regularly. Gave suggestions to try salmon, tuna, mackerel, sardines or sea bass. Patient currently consumes oatmeal and whole grain breads but not regularly. Encouraged patient to continue trying to add whole grains in daily diet. Patient has not watched salt intake in the past. Encouraged patient to try and start watching the amount of salt she uses when cooking and eating. Patient currently walks at least 2 days a week for an hour. Encouraged her to try and continue getting at least 20 minutes of movement in per day. Patient states that she also does a lot of walking while at work.    Navigation:  I will notify patient of lab results.  Patient is aware of 2 more health coaching sessions and a follow up.  Time: 20 minutes

## 2020-01-22 ENCOUNTER — Ambulatory Visit
Admission: RE | Admit: 2020-01-22 | Discharge: 2020-01-22 | Disposition: A | Discharge status: Home / Self Care | Payer: No Typology Code available for payment source | Source: Ambulatory Visit | Attending: Obstetrics and Gynecology | Admitting: Obstetrics and Gynecology

## 2020-01-22 ENCOUNTER — Other Ambulatory Visit: Payer: Self-pay | Admitting: Obstetrics and Gynecology

## 2020-01-22 DIAGNOSIS — N644 Mastodynia: Secondary | ICD-10-CM

## 2020-01-22 DIAGNOSIS — N6310 Unspecified lump in the right breast, unspecified quadrant: Secondary | ICD-10-CM

## 2020-01-22 LAB — GLUCOSE, RANDOM: Glucose: 91 mg/dL (ref 65–99)

## 2020-01-22 LAB — LIPID PANEL
Chol/HDL Ratio: 3 ratio (ref 0.0–4.4)
Cholesterol, Total: 204 mg/dL — ABNORMAL HIGH (ref 100–199)
HDL: 69 mg/dL (ref >39)
LDL Chol Calc (NIH): 124 mg/dL — ABNORMAL HIGH (ref 0–99)
Triglycerides: 61 mg/dL (ref 0–149)
VLDL Cholesterol Cal: 11 mg/dL (ref 5–40)

## 2020-01-22 LAB — HEMOGLOBIN A1C
Est. average glucose Bld gHb Est-mCnc: 120 mg/dL
Hgb A1c MFr Bld: 5.8 % — ABNORMAL HIGH (ref 4.8–5.6)

## 2020-01-25 NOTE — Telephone Encounter (Signed)
Patient called back and was informed Pap/HPV results are negative, revealed Trichomonas, the reason Flagyl was prescribed. Patient stated she will complete the medication tomorrow, and had viewed results via MyChart.

## 2020-01-29 ENCOUNTER — Telehealth: Payer: Self-pay

## 2020-01-29 NOTE — Telephone Encounter (Signed)
Left message for patient about lab results from [facility]. Left name and number for patient to call back.   

## 2020-01-30 ENCOUNTER — Telehealth: Payer: Self-pay

## 2020-01-30 NOTE — Telephone Encounter (Addendum)
Health coaching 2    Labs- 204 cholesterol, 124 LDL cholesterol, 61 triglycerides, 69 HDL cholesterol, 5.8 hemoglobin A1C, 91 mean plasma glucose. Patient understands and is aware of her lab results.   Goals-  Discussed lab results with patient and answered any questions that the patient had regarding results.  1. Decrease the amount of fried and fatty foods consumed. Try to grill, bake, broil or sautee foods instead. 2. Try to add in more heart healthy fish in weekly diet. Such as: salmon, tuna, mackerel, sardines or sea bass. 3. Increase the amount whole grains consumed. (brown rice, oatmeal, whole wheat bread or pasta and whole grain cereals) 4. Watch the amount of sweets and sugars consumed in both food and drink. 5. Watch the amount of carbs consumed.    Navigation:  Patient is aware of 1 more health coaching sessions and a follow up. Patient is scheduled for follow-up with Internal Medicine on Tuesday, November 23rd @ 8:45 for follow-up for elevated labs. Will refer patient to the [place] More Diabetes Prevention program.  Time- 10 minutes

## 2020-02-05 ENCOUNTER — Encounter: Payer: Self-pay | Admitting: Internal Medicine

## 2020-02-05 ENCOUNTER — Other Ambulatory Visit: Payer: Self-pay

## 2020-02-05 ENCOUNTER — Ambulatory Visit (INDEPENDENT_AMBULATORY_CARE_PROVIDER_SITE_OTHER): Payer: Self-pay | Admitting: Internal Medicine

## 2020-02-05 DIAGNOSIS — R7303 Prediabetes: Secondary | ICD-10-CM | POA: Insufficient documentation

## 2020-02-05 DIAGNOSIS — F17200 Nicotine dependence, unspecified, uncomplicated: Secondary | ICD-10-CM

## 2020-02-05 DIAGNOSIS — E785 Hyperlipidemia, unspecified: Secondary | ICD-10-CM | POA: Insufficient documentation

## 2020-02-05 NOTE — Assessment & Plan Note (Addendum)
Hgb A1c 5.8.  Discussed with patient that she is in the pre-diabetic range with a borderline elevation of her Hgb A1c. Recommended lifestyle modifications and mainly to quit smoking. Patient is already working on eating less sugar and quitting smoking.  Plan:  - Lifestyle modifications  - Continue to monitor

## 2020-02-05 NOTE — Assessment & Plan Note (Addendum)
Total cholesterol 204, LDL 124. No statin recommended because 10-year ASCVD risk <5%. Recommend lifestyle modifications. Patient states she walks a lot, both to and at work. She is working on her diet by increasing eating chicken/fish/turkey with salads and cutting out fried foods.

## 2020-02-05 NOTE — Progress Notes (Addendum)
   CC: elevated Hgb A1c and cholesterol  HPI:  Ms.Brenda Sweeney is a 49 y.o. with a recent myomectomy presenting from the [facility] program regarding her elevated Hgb A1c and cholesterol level. For details of today's visit and the status of her chronic medical issues please refer to the assessment and plan.  ADDENDUM-  PMHx: Abnormal Uterine Bleeding   SHx: She lives with her brother. Currently works the third (night) shift at [employer]. She is a current everyday smoker, 6 cigarettes/day. She is actively trying to quit. Denies any alcohol or illicit drug use.   FHx: Mother, father and brother have type II DM.   Surgical Hx: Tubal ligation, myomectomy    Review of Systems:    Review of Systems  Constitutional: Negative for chills, fever and malaise/fatigue.  Eyes: Negative for blurred vision, double vision and photophobia.  Respiratory: Negative for cough and shortness of breath.   Cardiovascular: Negative for chest pain, palpitations and leg swelling.  Gastrointestinal: Negative for abdominal pain, nausea and vomiting.  Genitourinary: Negative for dysuria, frequency and urgency.  Neurological: Positive for headaches. Negative for weakness.     Physical Exam:  Vitals:   02/05/20 0858  BP: 138/69  Pulse: (!) 56  Temp: 98.7 F (37.1 C)  SpO2: 100%  Weight: 184 lb 1.6 oz (83.5 kg)  Height: 5\' 5"  (1.651 m)    Physical Exam Constitutional:      General: She is not in acute distress.    Appearance: Normal appearance. She is not ill-appearing.  Eyes:     General:        Right eye: No discharge.        Left eye: No discharge.     Conjunctiva/sclera: Conjunctivae normal.     Pupils: Pupils are equal, round, and reactive to light.  Cardiovascular:     Rate and Rhythm: Regular rhythm. Tachycardia present.     Pulses: Normal pulses.     Heart sounds: Normal heart sounds. No murmur heard.  No friction rub. No gallop.   Pulmonary:     Effort: Pulmonary  effort is normal. No respiratory distress.     Breath sounds: Normal breath sounds. No wheezing or rales.  Abdominal:     General: Abdomen is flat. Bowel sounds are normal. There is no distension.     Palpations: Abdomen is soft.     Tenderness: There is no abdominal tenderness.  Musculoskeletal:        General: No swelling or tenderness.     Right lower leg: No edema.     Left lower leg: No edema.  Skin:    General: Skin is warm and dry.  Neurological:     General: No focal deficit present.     Mental Status: She is alert and oriented to person, place, and time.  Psychiatric:        Mood and Affect: Mood normal.        Behavior: Behavior normal.        Thought Content: Thought content normal.        Judgment: Judgment normal.     Assessment & Plan:   See Encounters Tab for problem based charting.  Patient discussed with Dr. Evette Doffing

## 2020-02-05 NOTE — Assessment & Plan Note (Addendum)
Patient is a current everyday smoker. She was smoking 10 cigarettes a day and now down to 6/day. She is working with [facility] and they are going to send her more resources as well as a nicotine patch and nicotine gum. Discussed importance of quitting and medication options, Chantix and Wellbutrin as lines of therapy she can consider if she needs more help to quit.   Plan:  - Patient counseled on tobacco cessation - Working with [facility]

## 2020-02-05 NOTE — Patient Instructions (Addendum)
Brenda Sweeney,   It was a pleasure meeting you today. Today we discussed the following your elevated cholesterol, pre-diabetes and most importantly quitting smoking. As we discussed, I recommend you continue lifestyle modifications with dietary changes, staying active and quitting smoking. I have provided you some information below that may help!   Please call the internal medicine center clinic if you have any questions or concerns, we may be able to help and keep you from a long and expensive emergency room wait. Our clinic and after hours phone number is [phone], the best time to call is Monday through Friday 9 am to 4 pm but there is always someone available 24/7 if you have an emergency. If you need medication refills please notify your pharmacy one week in advance and they will send [place] a request.   Thank you for allowing [place] to be a part of your care!      Dyslipidemia Dyslipidemia is an imbalance of waxy, fat-like substances (lipids) in the blood. The body needs lipids in small amounts. Dyslipidemia often involves a high level of cholesterol or triglycerides, which are types of lipids. Common forms of dyslipidemia include:  High levels of LDL cholesterol. LDL is the type of cholesterol that causes fatty deposits (plaques) to build up in the blood vessels that carry blood away from your heart (arteries).  Low levels of HDL cholesterol. HDL cholesterol is the type of cholesterol that protects against heart disease. High levels of HDL remove the LDL buildup from arteries.  High levels of triglycerides. Triglycerides are a fatty substance in the blood that is linked to a buildup of plaques in the arteries. What are the causes? Primary dyslipidemia is caused by changes (mutations) in genes that are passed down through families (inherited). These mutations cause several types of dyslipidemia. Secondary dyslipidemia is caused by lifestyle choices and diseases that lead to  dyslipidemia, such as:  Eating a diet that is high in animal fat.  Not getting enough exercise.  Having diabetes, kidney disease, liver disease, or thyroid disease.  Drinking large amounts of alcohol.  Using certain medicines. What increases the risk? You are more likely to develop this condition if you are an older man or if you are a woman who has gone through menopause. Other risk factors include:  Having a family history of dyslipidemia.  Taking certain medicines, including birth control pills, steroids, some diuretics, and beta-blockers.  Smoking cigarettes.  Eating a high-fat diet.  Having certain medical conditions such as diabetes, polycystic ovary syndrome (PCOS), kidney disease, liver disease, or hypothyroidism.  Not exercising regularly.  Being overweight or obese with too much belly fat. What are the signs or symptoms? In most cases, dyslipidemia does not usually cause any symptoms. In severe cases, very high lipid levels can cause:  Fatty bumps under the skin (xanthomas).  White or gray ring around the black center (pupil) of the eye. Very high triglyceride levels can cause inflammation of the pancreas (pancreatitis). How is this diagnosed? Your health care provider may diagnose dyslipidemia based on a routine blood test (fasting blood test). Because most people do not have symptoms of the condition, this blood testing (lipid profile) is done on adults age 40 and older and is repeated every 5 years. This test checks:  Total cholesterol. This measures the total amount of cholesterol in your blood, including LDL cholesterol, HDL cholesterol, and triglycerides. A healthy number is below 200.  LDL cholesterol. The target number for LDL cholesterol is different for  each person, depending on individual risk factors. Ask your health care provider what your LDL cholesterol should be.  HDL cholesterol. An HDL level of 60 or higher is best because it helps to protect  against heart disease. A number below 4 for men or below 62 for women increases the risk for heart disease.  Triglycerides. A healthy triglyceride number is below 150. If your lipid profile is abnormal, your health care provider may do other blood tests. How is this treated? Treatment depends on the type of dyslipidemia that you have and your other risk factors for heart disease and stroke. Your health care provider will have a target range for your lipid levels based on this information. For many people, this condition may be treated by lifestyle changes, such as diet and exercise. Your health care provider may recommend that you:  Get regular exercise.  Make changes to your diet.  Quit smoking if you smoke. If diet changes and exercise do not help you reach your goals, your health care provider may also prescribe medicine to lower lipids. The most commonly prescribed type of medicine lowers your LDL cholesterol (statin drug). If you have a high triglyceride level, your provider may prescribe another type of drug (fibrate) or an omega-3 fish oil supplement, or both. Follow these instructions at home:  Eating and drinking  Follow instructions from your health care provider or dietitian about eating or drinking restrictions.  Eat a healthy diet as told by your health care provider. This can help you reach and maintain a healthy weight, lower your LDL cholesterol, and raise your HDL cholesterol. This may include: ? Limiting your calories, if you are overweight. ? Eating more fruits, vegetables, whole grains, fish, and lean meats. ? Limiting saturated fat, trans fat, and cholesterol.  If you drink alcohol: ? Limit how much you use. ? Be aware of how much alcohol is in your drink. In the [place] one drink equals one 12 oz bottle of beer (355 mL), one 5 oz glass of wine (148 mL), or one 1 oz glass of hard liquor (44 mL).  Do not drink alcohol if: ? Your health care provider tells you not to  drink. ? You are pregnant, may be pregnant, or are planning to become pregnant. Activity  Get regular exercise. Start an exercise and strength training program as told by your health care provider. Ask your health care provider what activities are safe for you. Your health care provider may recommend: ? 30 minutes of aerobic activity 4-6 days a week. Brisk walking is an example of aerobic activity. ? Strength training 2 days a week. General instructions  Do not use any products that contain nicotine or tobacco, such as cigarettes, e-cigarettes, and chewing tobacco. If you need help quitting, ask your health care provider.  Take over-the-counter and prescription medicines only as told by your health care provider. This includes supplements.  Keep all follow-up visits as told by your health care provider. Contact a health care provider if:  You are: ? Having trouble sticking to your exercise or diet plan. ? Struggling to quit smoking or control your use of alcohol. Summary  Dyslipidemia often involves a high level of cholesterol or triglycerides, which are types of lipids.  Treatment depends on the type of dyslipidemia that you have and your other risk factors for heart disease and stroke.  For many people, treatment starts with lifestyle changes, such as diet and exercise.  Your health care provider may prescribe medicine  to lower lipids. This information is not intended to replace advice given to you by your health care provider. Make sure you discuss any questions you have with your health care provider. Document Revised: 10/24/2017 Document Reviewed: 09/30/2017 Elsevier Patient Education  [place].     Prediabetes Eating Plan Prediabetes is a condition that causes blood sugar (glucose) levels to be higher than normal. This increases the risk for developing diabetes. In order to prevent diabetes from developing, your health care provider may recommend a diet and other  lifestyle changes to help you:  Control your blood glucose levels.  Improve your cholesterol levels.  Manage your blood pressure. Your health care provider may recommend working with a diet and nutrition specialist (dietitian) to make a meal plan that is best for you. What are tips for following this plan? Lifestyle  Set weight loss goals with the help of your health care team. It is recommended that most people with prediabetes lose 7% of their current body weight.  Exercise for at least 30 minutes at least 5 days a week.  Attend a support group or seek ongoing support from a mental health counselor.  Take over-the-counter and prescription medicines only as told by your health care provider. Reading food labels  Read food labels to check the amount of fat, salt (sodium), and sugar in prepackaged foods. Avoid foods that have: ? Saturated fats. ? Trans fats. ? Added sugars.  Avoid foods that have more than 300 milligrams (mg) of sodium per serving. Limit your daily sodium intake to less than 2,300 mg each day. Shopping  Avoid buying pre-made and processed foods. Cooking  Cook with olive oil. Do not use butter, lard, or ghee.  Bake, broil, grill, or boil foods. Avoid frying. Meal planning   Work with your dietitian to develop an eating plan that is right for you. This may include: ? Tracking how many calories you take in. Use a food diary, notebook, or mobile application to track what you eat at each meal. ? Using the glycemic index (GI) to plan your meals. The index tells you how quickly a food will raise your blood glucose. Choose low-GI foods. These foods take a longer time to raise blood glucose.  Consider following a Mediterranean diet. This diet includes: ? Several servings each day of fresh fruits and vegetables. ? Eating fish at least twice a week. ? Several servings each day of whole grains, beans, nuts, and seeds. ? Using olive oil instead of other  fats. ? Moderate alcohol consumption. ? Eating small amounts of red meat and whole-fat dairy.  If you have high blood pressure, you may need to limit your sodium intake or follow a diet such as the DASH eating plan. DASH is an eating plan that aims to lower high blood pressure. What foods are recommended? The items listed below may not be a complete list. Talk with your dietitian about what dietary choices are best for you. Grains Whole grains, such as whole-wheat or whole-grain breads, crackers, cereals, and pasta. Unsweetened oatmeal. Bulgur. Barley. Quinoa. Brown rice. Corn or whole-wheat flour tortillas or taco shells. Vegetables Lettuce. Spinach. Peas. Beets. Cauliflower. Cabbage. Broccoli. Carrots. Tomatoes. Squash. Eggplant. Herbs. Peppers. Onions. Cucumbers. Brussels sprouts. Fruits Berries. Bananas. Apples. Oranges. Grapes. Papaya. Mango. Pomegranate. Kiwi. Grapefruit. Cherries. Meats and other protein foods Seafood. Poultry without skin. Lean cuts of pork and beef. Tofu. Eggs. Nuts. Beans. Dairy Low-fat or fat-free dairy products, such as yogurt, cottage cheese, and cheese. Beverages  Water. Tea. Coffee. Sugar-free or diet soda. Seltzer water. Lowfat or no-fat milk. Milk alternatives, such as soy or almond milk. Fats and oils Olive oil. Canola oil. Sunflower oil. Grapeseed oil. Avocado. Walnuts. Sweets and desserts Sugar-free or low-fat pudding. Sugar-free or low-fat ice cream and other frozen treats. Seasoning and other foods Herbs. Sodium-free spices. Mustard. Relish. Low-fat, low-sugar ketchup. Low-fat, low-sugar barbecue sauce. Low-fat or fat-free mayonnaise. What foods are not recommended? The items listed below may not be a complete list. Talk with your dietitian about what dietary choices are best for you. Grains Refined white flour and flour products, such as bread, pasta, snack foods, and cereals. Vegetables Canned vegetables. Frozen vegetables with butter or cream  sauce. Fruits Fruits canned with syrup. Meats and other protein foods Fatty cuts of meat. Poultry with skin. Breaded or fried meat. Processed meats. Dairy Full-fat yogurt, cheese, or milk. Beverages Sweetened drinks, such as sweet iced tea and soda. Fats and oils Butter. Lard. Ghee. Sweets and desserts Baked goods, such as cake, cupcakes, pastries, cookies, and cheesecake. Seasoning and other foods Spice mixes with added salt. Ketchup. Barbecue sauce. Mayonnaise. Summary  To prevent diabetes from developing, you may need to make diet and other lifestyle changes to help control blood sugar, improve cholesterol levels, and manage your blood pressure.  Set weight loss goals with the help of your health care team. It is recommended that most people with prediabetes lose 7 percent of their current body weight.  Consider following a Mediterranean diet that includes plenty of fresh fruits and vegetables, whole grains, beans, nuts, seeds, fish, lean meat, low-fat dairy, and healthy oils. This information is not intended to replace advice given to you by your health care provider. Make sure you discuss any questions you have with your health care provider. Document Revised: 06/23/2018 Document Reviewed: 05/05/2016 Elsevier Patient Education  2020 [place].     Preventing Type 2 Diabetes Mellitus Type 2 diabetes (type 2 diabetes mellitus) is a long-term (chronic) disease that affects blood sugar (glucose) levels. Normally, a hormone called insulin allows glucose to enter cells in the body. The cells use glucose for energy. In type 2 diabetes, one or both of these problems may be present:  The body does not make enough insulin.  The body does not respond properly to insulin that it makes (insulin resistance). Insulin resistance or lack of insulin causes excess glucose to build up in the blood instead of going into cells. As a result, high blood glucose (hyperglycemia) develops, which can  cause many complications. Being overweight or obese and having an inactive (sedentary) lifestyle can increase your risk for diabetes. Type 2 diabetes can be delayed or prevented by making certain nutrition and lifestyle changes. What nutrition changes can be made?   Eat healthy meals and snacks regularly. Keep a healthy snack with you for when you get hungry between meals, such as fruit or a handful of nuts.  Eat lean meats and proteins that are low in saturated fats, such as chicken, fish, egg whites, and beans. Avoid processed meats.  Eat plenty of fruits and vegetables and plenty of grains that have not been processed (whole grains). It is recommended that you eat: ? 1?2 cups of fruit every day. ? 2?3 cups of vegetables every day. ? 6?8 oz of whole grains every day, such as oats, whole wheat, bulgur, brown rice, quinoa, and millet.  Eat low-fat dairy products, such as milk, yogurt, and cheese.  Eat foods that contain healthy  fats, such as nuts, avocado, olive oil, and canola oil.  Drink water throughout the day. Avoid drinks that contain added sugar, such as soda or sweet tea.  Follow instructions from your health care provider about specific eating or drinking restrictions.  Control how much food you eat at a time (portion size). ? Check food labels to find out the serving sizes of foods. ? Use a kitchen scale to weigh amounts of foods.  Saute or steam food instead of frying it. Cook with water or broth instead of oils or butter.  Limit your intake of: ? Salt (sodium). Have no more than 1 tsp (2,400 mg) of sodium a day. If you have heart disease or high blood pressure, have less than ? tsp (1,500 mg) of sodium a day. ? Saturated fat. This is fat that is solid at room temperature, such as butter or fat on meat. What lifestyle changes can be made? Activity   Do moderate-intensity physical activity for at least 30 minutes on at least 5 days of the week, or as much as told by  your health care provider.  Ask your health care provider what activities are safe for you. A mix of physical activities may be best, such as walking, swimming, cycling, and strength training.  Try to add physical activity into your day. For example: ? Park in spots that are farther away than usual, so that you walk more. For example, park in a far corner of the parking lot when you go to the office or the grocery store. ? Take a walk during your lunch break. ? Use stairs instead of elevators or escalators. Weight Loss  Lose weight as directed. Your health care provider can determine how much weight loss is best for you and can help you lose weight safely.  If you are overweight or obese, you may be instructed to lose at least 5?7 % of your body weight. Alcohol and Tobacco   Limit alcohol intake to no more than 1 drink a day for nonpregnant women and 2 drinks a day for men. One drink equals 12 oz of beer, 5 oz of wine, or 1 oz of hard liquor.  Do not use any tobacco products, such as cigarettes, chewing tobacco, and e-cigarettes. If you need help quitting, ask your health care provider. Work With [place] Provider  Have your blood glucose tested regularly, as told by your health care provider.  Discuss your risk factors and how you can reduce your risk for diabetes.  Get screening tests as told by your health care provider. You may have screening tests regularly, especially if you have certain risk factors for type 2 diabetes.  Make an appointment with a diet and nutrition specialist (registered dietitian). A registered dietitian can help you make a healthy eating plan and can help you understand portion sizes and food labels. Why are these changes important?  It is possible to prevent or delay type 2 diabetes and related health problems by making lifestyle and nutrition changes.  It can be difficult to recognize signs of type 2 diabetes. The best way to avoid possible damage  to your body is to take actions to prevent the disease before you develop symptoms. What can happen if changes are not made?  Your blood glucose levels may keep increasing. Having high blood glucose for a long time is dangerous. Too much glucose in your blood can damage your blood vessels, heart, kidneys, nerves, and eyes.  You may develop  prediabetes or type 2 diabetes. Type 2 diabetes can lead to many chronic health problems and complications, such as: ? Heart disease. ? Stroke. ? Blindness. ? Kidney disease. ? Depression. ? Poor circulation in the feet and legs, which could lead to surgical removal (amputation) in severe cases. Where to find support  Ask your health care provider to recommend a registered dietitian, diabetes educator, or weight loss program.  Look for local or online weight loss groups.  Join a gym, fitness club, or outdoor activity group, such as a walking club. Where to find more information To learn more about diabetes and diabetes prevention, visit:  American Diabetes Association (ADA): www.diabetes.[employer] of Diabetes and Digestive and Kidney Diseases: FindSpin.nl To learn more about healthy eating, visit:  The [place]. Department of Agriculture Scientist, research (physical sciences)), Choose My Plate: http://wiley-williams.com/  Office of Disease Prevention and Health Promotion (ODPHP), Dietary Guidelines: SurferLive.at Summary  You can reduce your risk for type 2 diabetes by increasing your physical activity, eating healthy foods, and losing weight as directed.  Talk with your health care provider about your risk for type 2 diabetes. Ask about any blood tests or screening tests that you need to have. This information is not intended to replace advice given to you by your health care provider. Make sure you discuss any questions you have with your health care provider. Document Revised: 06/23/2018 Document Reviewed:  04/22/2015 Elsevier Patient Education  2020 [employer].    Steps to Quit Smoking Smoking tobacco is the leading cause of preventable death. It can affect almost every organ in the body. Smoking puts you and people around you at risk for many serious, long-lasting (chronic) diseases. Quitting smoking can be hard, but it is one of the best things that you can do for your health. It is never too late to quit. How do I get ready to quit? When you decide to quit smoking, make a plan to help you succeed. Before you quit:  Pick a date to quit. Set a date within the next 2 weeks to give you time to prepare.  Write down the reasons why you are quitting. Keep this list in places where you will see it often.  Tell your family, friends, and co-workers that you are quitting. Their support is important.  Talk with your doctor about the choices that may help you quit.  Find out if your health insurance will pay for these treatments.  Know the people, places, things, and activities that make you want to smoke (triggers). Avoid them. What first steps can I take to quit smoking?  Throw away all cigarettes at home, at work, and in your car.  Throw away the things that you use when you smoke, such as ashtrays and lighters.  Clean your car. Make sure to empty the ashtray.  Clean your home, including curtains and carpets. What can I do to help me quit smoking? Talk with your doctor about taking medicines and seeing a counselor at the same time. You are more likely to succeed when you do both.  If you are pregnant or breastfeeding, talk with your doctor about counseling or other ways to quit smoking. Do not take medicine to help you quit smoking unless your doctor tells you to do so. To quit smoking: Quit right away  Quit smoking totally, instead of slowly cutting back on how much you smoke over a period of time.  Go to counseling. You are more likely to quit if you go  to counseling sessions  regularly. Take medicine You may take medicines to help you quit. Some medicines need a prescription, and some you can buy over-the-counter. Some medicines may contain a drug called nicotine to replace the nicotine in cigarettes. Medicines may:  Help you to stop having the desire to smoke (cravings).  Help to stop the problems that come when you stop smoking (withdrawal symptoms). Your doctor may ask you to use:  Nicotine patches, gum, or lozenges.  Nicotine inhalers or sprays.  Non-nicotine medicine that is taken by mouth. Find resources Find resources and other ways to help you quit smoking and remain smoke-free after you quit. These resources are most helpful when you use them often. They include:  Online chats with a Social worker.  Phone quitlines.  Printed Furniture conservator/restorer.  Support groups or group counseling.  Text messaging programs.  Mobile phone apps. Use apps on your mobile phone or tablet that can help you stick to your quit plan. There are many free apps for mobile phones and tablets as well as websites. Examples include Quit Guide from the [employer] and smokefree.gov  What things can I do to make it easier to quit?   Talk to your family and friends. Ask them to support and encourage you.  Call a phone quitline (1-800-QUIT-NOW), reach out to support groups, or work with a Social worker.  Ask people who smoke to not smoke around you.  Avoid places that make you want to smoke, such as: ? Bars. ? Parties. ? Smoke-break areas at work.  Spend time with people who do not smoke.  Lower the stress in your life. Stress can make you want to smoke. Try these things to help your stress: ? Getting regular exercise. ? Doing deep-breathing exercises. ? Doing yoga. ? Meditating. ? Doing a body scan. To do this, close your eyes, focus on one area of your body at a time from head to toe. Notice which parts of your body are tense. Try to relax the muscles in those areas. How will I  feel when I quit smoking? Day 1 to 3 weeks Within the first 24 hours, you may start to have some problems that come from quitting tobacco. These problems are very bad 2-3 days after you quit, but they do not often last for more than 2-3 weeks. You may get these symptoms:  Mood swings.  Feeling restless, nervous, angry, or annoyed.  Trouble concentrating.  Dizziness.  Strong desire for high-sugar foods and nicotine.  Weight gain.  Trouble pooping (constipation).  Feeling like you may vomit (nausea).  Coughing or a sore throat.  Changes in how the medicines that you take for other issues work in your body.  Depression.  Trouble sleeping (insomnia). Week 3 and afterward After the first 2-3 weeks of quitting, you may start to notice more positive results, such as:  Better sense of smell and taste.  Less coughing and sore throat.  Slower heart rate.  Lower blood pressure.  Clearer skin.  Better breathing.  Fewer sick days. Quitting smoking can be hard. Do not give up if you fail the first time. Some people need to try a few times before they succeed. Do your best to stick to your quit plan, and talk with your doctor if you have any questions or concerns. Summary  Smoking tobacco is the leading cause of preventable death. Quitting smoking can be hard, but it is one of the best things that you can do for your health.  When you decide to quit smoking, make a plan to help you succeed.  Quit smoking right away, not slowly over a period of time.  When you start quitting, seek help from your doctor, family, or friends. This information is not intended to replace advice given to you by your health care provider. Make sure you discuss any questions you have with your health care provider. Document Revised: 11/24/2018 Document Reviewed: 05/20/2018 Elsevier Patient Education  [place].

## 2020-02-11 NOTE — Progress Notes (Signed)
Internal Medicine Clinic Attending ° °Case discussed with Dr. Rehman  At the time of the visit.  We reviewed the resident’s history and exam and pertinent patient test results.  I agree with the assessment, diagnosis, and plan of care documented in the resident’s note.  ° °

## 2021-03-29 ENCOUNTER — Ambulatory Visit (INDEPENDENT_AMBULATORY_CARE_PROVIDER_SITE_OTHER): Payer: 59

## 2021-03-29 ENCOUNTER — Other Ambulatory Visit: Payer: Self-pay

## 2021-03-29 ENCOUNTER — Encounter (HOSPITAL_COMMUNITY): Payer: Self-pay | Admitting: Emergency Medicine

## 2021-03-29 ENCOUNTER — Ambulatory Visit (HOSPITAL_COMMUNITY)
Admission: EM | Admit: 2021-03-29 | Discharge: 2021-03-29 | Disposition: A | Discharge status: Home / Self Care | Payer: 59 | Attending: Physician Assistant | Admitting: Physician Assistant

## 2021-03-29 DIAGNOSIS — M545 Low back pain, unspecified: Secondary | ICD-10-CM | POA: Diagnosis not present

## 2021-03-29 LAB — POCT URINALYSIS DIPSTICK, ED / UC
Bilirubin Urine: NEGATIVE
Glucose, UA: NEGATIVE mg/dL
Ketones, ur: NEGATIVE mg/dL
Nitrite: NEGATIVE
Protein, ur: NEGATIVE mg/dL
Specific Gravity, Urine: 1.02 (ref 1.005–1.030)
Urobilinogen, UA: 0.2 mg/dL (ref 0.0–1.0)
pH: 5.5 (ref 5.0–8.0)

## 2021-03-29 MED ORDER — NAPROXEN 500 MG PO TABS: 500.0000 mg | ORAL_TABLET | Freq: Two times a day (BID) | ORAL | 0 refills | Status: DC

## 2021-03-29 MED ORDER — TIZANIDINE HCL 4 MG PO TABS: 4.0000 mg | ORAL_TABLET | Freq: Three times a day (TID) | ORAL | 0 refills | Status: DC | PRN

## 2021-03-29 MED ORDER — KETOROLAC TROMETHAMINE 60 MG/2ML IM SOLN
INTRAMUSCULAR | Status: AC
  Filled 2021-03-29: qty 2

## 2021-03-29 MED ORDER — KETOROLAC TROMETHAMINE 60 MG/2ML IM SOLN
60.0000 mg | Freq: Once | INTRAMUSCULAR | Status: AC
  Administered 2021-03-29: 60 mg via INTRAMUSCULAR

## 2021-03-29 NOTE — ED Provider Notes (Signed)
[place]    CSN: [id number] Arrival date & time: 03/29/21  1129      History   Chief Complaint Chief Complaint  Patient presents with   Back Pain    HPI Brenda Sweeney is a 51 y.o. female.   Patient presents today with a 24-hour history of worsening lower back pain.  She reports pain is rated 9 on a 0-10 pain scale, localized to midline lower back without radiation, described as aching, no aggravating or relieving factors identified.  She has tried warm compresses and gentle stretch without improvement of symptoms.  Denies any known injury or increase in activity prior to symptom onset but symptoms that began as she was at work.  She has not tried any over-the-counter medication for symptom management.  She denies history of back pain or previous spinal surgery.  She denies history of malignancy.  She denies any bowel/bladder incontinence, lower extremity weakness, saddle anesthesia, urinary/bowel retention.  She denies any urinary symptoms.  She is having difficulty with daily activities as result of symptoms.   Past Medical History:  Diagnosis Date   Medical history non-contributory     Patient Active Problem List   Diagnosis Date Noted   Dyslipidemia 02/05/2020   Prediabetes 02/05/2020   Tobacco use disorder 02/05/2020   Endometrial mass 12/19/2019   Abnormal uterine bleeding (AUB) 12/19/2019    Past Surgical History:  Procedure Laterality Date   DILATATION & CURETTAGE/HYSTEROSCOPY WITH MYOSURE N/A 12/19/2019   Procedure: DILATATION & CURETTAGE, HYSTEROSCOPY, MYOMECTOMY;  Surgeon: Osborne Oman, MD;  Location: [facility];  Service: Gynecology;  Laterality: N/A;   TUBAL LIGATION      OB History     Gravida  2   Para  2   Term  1   Preterm  1   AB      Living  2      SAB      IAB      Ectopic      Multiple      Live Births  2            Home Medications    Prior to Admission medications    Medication Sig Start Date End Date Taking? Authorizing Provider  Multiple Vitamin (MULTIVITAMIN) capsule Take 1 capsule by mouth daily.   Yes [provider]  naproxen (NAPROSYN) 500 MG tablet Take 1 tablet (500 mg total) by mouth 2 (two) times daily. 03/29/21  Yes Louay Myrie K, PA-C  tiZANidine (ZANAFLEX) 4 MG tablet Take 1 tablet (4 mg total) by mouth every 8 (eight) hours as needed for muscle spasms. 03/29/21  Yes Xitlaly Ault K, PA-C  BLACK ELDERBERRY,BERRY-FLOWER, PO Take 1 tablet by mouth.     [provider]    Family History Family History  Problem Relation Age of Onset   Hypertension Mother    Diabetes Mother    Diabetes Father    Hypertension Father    High Cholesterol Father    Diabetes Brother    Hypertension Brother    Diabetes Paternal Grandmother    Hypertension Paternal Grandmother     Social History Social History   Tobacco Use   Smoking status: Every Day    Packs/day: 0.25    Years: 30.00    Pack years: 7.50    Types: Cigarettes   Smokeless tobacco: Never  Vaping Use   Vaping Use: Never used  Substance Use Topics   Alcohol use: No  Drug use: No     Allergies   Prozac [fluoxetine hcl] and Prednisone   Review of Systems Review of Systems  Constitutional:  Positive for activity change. Negative for appetite change, fatigue and fever.  Respiratory:  Negative for cough and shortness of breath.   Cardiovascular:  Negative for chest pain.  Gastrointestinal:  Negative for abdominal pain, diarrhea, nausea and vomiting.  Genitourinary:  Negative for dysuria, frequency and urgency.  Musculoskeletal:  Positive for back pain. Negative for arthralgias and myalgias.  Neurological:  Negative for dizziness, light-headedness and headaches.    Physical Exam Triage Vital Signs ED Triage Vitals  Enc Vitals Group     BP 03/29/21 1150 105/67     Pulse Rate 03/29/21 1150 67     Resp 03/29/21 1150 16     Temp 03/29/21 1150 98.4 F (36.9 C)      Temp Source 03/29/21 1150 Oral     SpO2 03/29/21 1150 97 %     Weight --      Height --      Head Circumference --      Peak Flow --      Pain Score 03/29/21 1148 9     Pain Loc --      Pain Edu? --      Excl. in [place]? --    No data found.  Updated Vital Signs BP 105/67    Pulse 67    Temp 98.4 F (36.9 C) (Oral)    Resp 16    LMP 03/22/2021    SpO2 97%   Visual Acuity Right Eye Distance:   Left Eye Distance:   Bilateral Distance:    Right Eye Near:   Left Eye Near:    Bilateral Near:     Physical Exam Vitals reviewed.  Constitutional:      General: She is awake. She is not in acute distress.    Appearance: Normal appearance. She is well-developed. She is not ill-appearing.     Comments: Very pleasant female appears stated age in no acute distress sitting comfortably in exam room  HENT:     Head: Normocephalic and atraumatic.  Cardiovascular:     Rate and Rhythm: Normal rate and regular rhythm.     Heart sounds: Normal heart sounds, S1 normal and S2 normal. No murmur heard. Pulmonary:     Effort: Pulmonary effort is normal.     Breath sounds: Normal breath sounds. No wheezing, rhonchi or rales.     Comments: Clear to auscultation bilaterally Abdominal:     General: Bowel sounds are normal.     Palpations: Abdomen is soft.     Tenderness: There is no abdominal tenderness. There is no right CVA tenderness, left CVA tenderness, guarding or rebound.  Musculoskeletal:     Cervical back: No tenderness or bony tenderness.     Thoracic back: No tenderness or bony tenderness.     Lumbar back: Tenderness and bony tenderness present. Decreased range of motion.     Comments: Back: Pain with percussion over lumbar vertebrae.  Tenderness palpation of bilateral lumbar paraspinal muscles.  No deformity or step-off noted.  Decreased range of motion with forward flexion, extension, rotation.  Patient unable to lie flat for straight leg raise.  Psychiatric:        Behavior:  Behavior is cooperative.     UC Treatments / Results  Labs (all labs ordered are listed, but only abnormal results are displayed) Labs Reviewed  POCT URINALYSIS DIPSTICK,  ED / UC - Abnormal; Notable for the following components:      Result Value   Hgb urine dipstick LARGE (*)    Leukocytes,Ua TRACE (*)    All other components within normal limits  URINE CULTURE    EKG   Radiology DG Lumbar Spine Complete  Result Date: 03/29/2021 CLINICAL DATA:  Back pain beginning last night.  No injury. EXAM: LUMBAR SPINE - COMPLETE 4+ VIEW COMPARISON:  None. FINDINGS: There is no evidence of lumbar spine fracture. Alignment is normal. Intervertebral disc spaces are maintained. IMPRESSION: Negative. Electronically Signed   By: Lajean Manes M.D.   On: 03/29/2021 12:27    Procedures Procedures (including critical care time)  Medications Ordered in UC Medications  ketorolac (TORADOL) injection 60 mg (60 mg Intramuscular Given 03/29/21 1242)    Initial Impression / Assessment and Plan / UC Course  I have reviewed the triage vital signs and the nursing notes.  Pertinent labs & imaging results that were available during my care of the patient were reviewed by me and considered in my medical decision making (see chart for details).     X-ray obtained given significant pain with percussion of vertebrae showed no osseous abnormalities.  Urine was obtained that showed leukocyte esterase without nitrates.  Patient denies any significant UTI symptoms we will send this for culture but defer antibiotics for the time being.  She was given Toradol in clinic with improvement but not resolution of symptoms.  We will start Naprosyn tomorrow since she was just given Toradol injection.  She was instructed not to take additional NSAIDs with this medication due to risk of GI bleeding.  She was started on Zanaflex with instruction not to drive or drink alcohol with this medication as drowsiness is a common side  effect.  Recommended she use heat, rest, stretch for symptom relief.  Discussed that she may benefit from physical therapy or require MRI which are not things we can arrange through urgent care; she was given contact information for local orthopedic clinic and encouraged to call to schedule an appointment with them as soon as possible.  Discussed that if she has any worsening symptoms including bowel/bladder incontinence, lower extremity weakness, saddle anesthesia, increased pain she needs to be seen immediately.  Strict return precautions given to which she expressed understanding.  Work excuse note provided.  Final Clinical Impressions(s) / UC Diagnoses   Final diagnoses:  Acute midline low back pain without sciatica     Discharge Instructions      Start tizanidine (Zanaflex) 3 times a day for muscle pain.  This can make you sleepy so do not drive or drink alcohol with taking it.  We gave you an injection of Toradol today so please use Tylenol for the rest of today for pain relief.  Starting tomorrow you can use Naprosyn twice daily for pain and inflammation.  You should not take additional NSAIDs including aspirin, ibuprofen/Advil, naproxen/Aleve with this medication as it can cause stomach bleeding.  You can continue taking Tylenol as needed for additional pain.  Use heat, rest, stretch for symptom relief.  Call orthopedics to schedule an appointment as soon as possible.  If you have any worsening symptoms including increased pain, fever, lower extremity weakness, going to the bathroom on yourself without noticing it or trouble going to the bathroom, numbness or tingling sensation you need to be seen immediately.     ED Prescriptions     Medication Sig Dispense Auth. Provider  naproxen (NAPROSYN) 500 MG tablet Take 1 tablet (500 mg total) by mouth 2 (two) times daily. 20 tablet Javarri Segal K, PA-C   tiZANidine (ZANAFLEX) 4 MG tablet Take 1 tablet (4 mg total) by mouth every 8 (eight)  hours as needed for muscle spasms. 30 tablet Royetta Probus, Derry Skill, PA-C      PDMP not reviewed this encounter.   Terrilee Croak, PA-C 03/29/21 1306

## 2021-03-29 NOTE — ED Triage Notes (Signed)
PT reports lower back pain, no radiation. Pain started gradually at work last night.

## 2021-03-29 NOTE — Discharge Instructions (Signed)
Start tizanidine (Zanaflex) 3 times a day for muscle pain.  This can make you sleepy so do not drive or drink alcohol with taking it.  We gave you an injection of Toradol today so please use Tylenol for the rest of today for pain relief.  Starting tomorrow you can use Naprosyn twice daily for pain and inflammation.  You should not take additional NSAIDs including aspirin, ibuprofen/Advil, naproxen/Aleve with this medication as it can cause stomach bleeding.  You can continue taking Tylenol as needed for additional pain.  Use heat, rest, stretch for symptom relief.  Call orthopedics to schedule an appointment as soon as possible.  If you have any worsening symptoms including increased pain, fever, lower extremity weakness, going to the bathroom on yourself without noticing it or trouble going to the bathroom, numbness or tingling sensation you need to be seen immediately.

## 2021-03-30 LAB — URINE CULTURE: Culture: <10000 — AB

## 2021-06-22 IMAGING — CT CT ABD-PELV W/ CM
2 of 5 series · 15 of 46 positions shown, 17 images · IV contrast (APPLIED)
Comparison: No priors.

CLINICAL DATA: 49-year-old female with history of left lateral
flank pain for 1 month and right lateral hip pain for 1 month.

EXAM:
CT ABDOMEN AND PELVIS WITH CONTRAST
TECHNIQUE: Multidetector CT imaging of the abdomen and pelvis was performed
using the standard protocol following bolus administration of
intravenous contrast.
CONTRAST:  100mL OMNIPAQUE IOHEXOL 300 MG/ML  SOLN

[Series 3: abdomen 5.0 · axial · 0.70mm/px · z∈[-720,-300]mm · 12 of 98 slices shown, 14 images]
[im 7/98  soft-tissue]
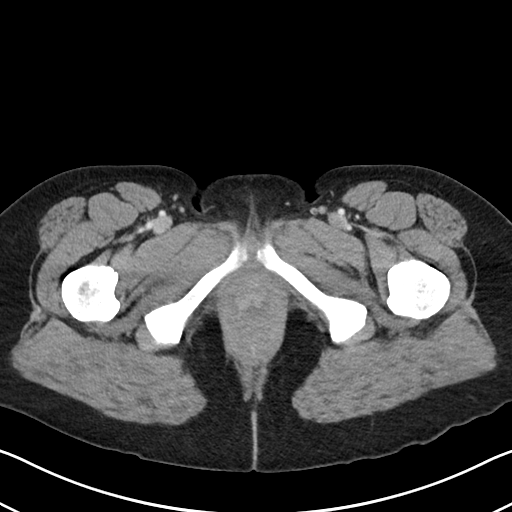
[im 7/98  bone]
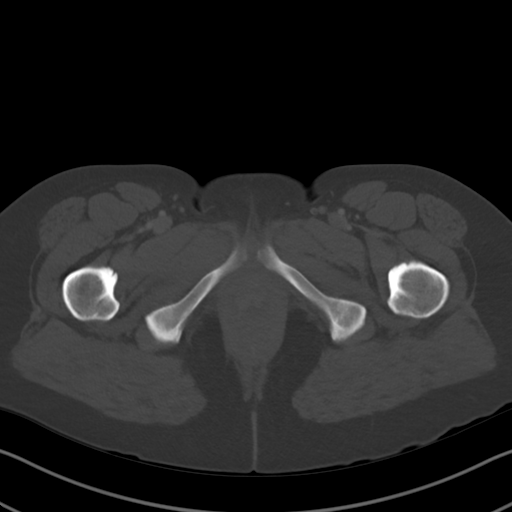
[im 13/98  soft-tissue]
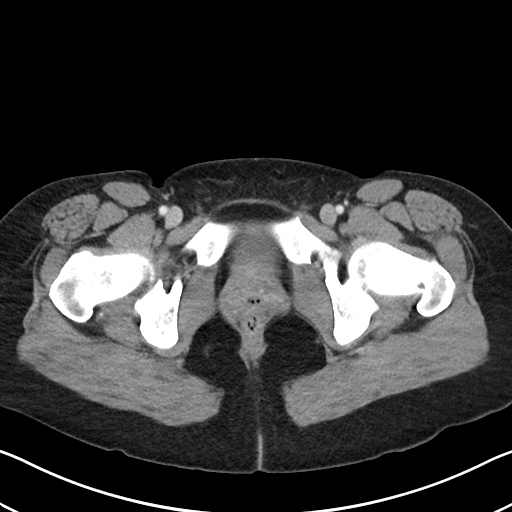
[im 25/98  soft-tissue]
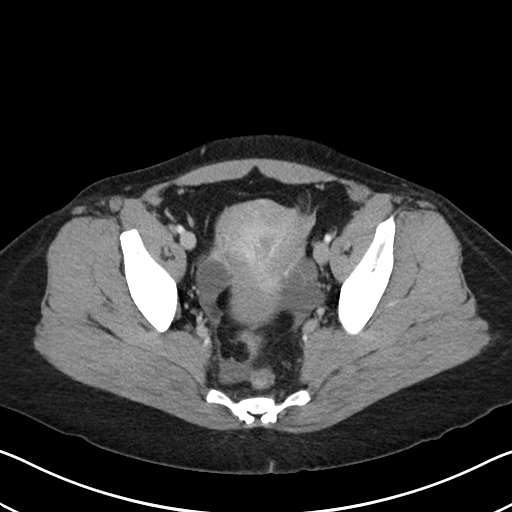
[im 31/98  soft-tissue]
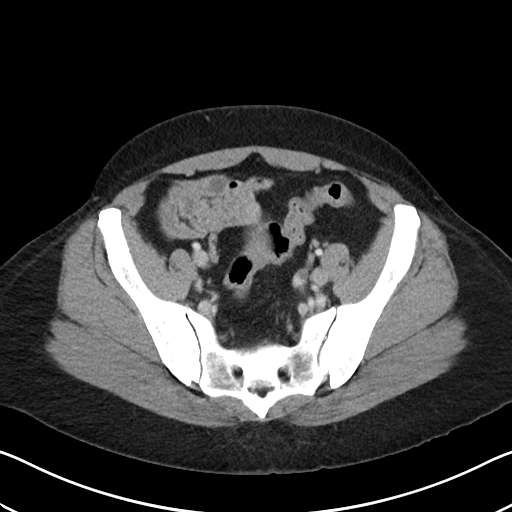
[im 37/98  soft-tissue]
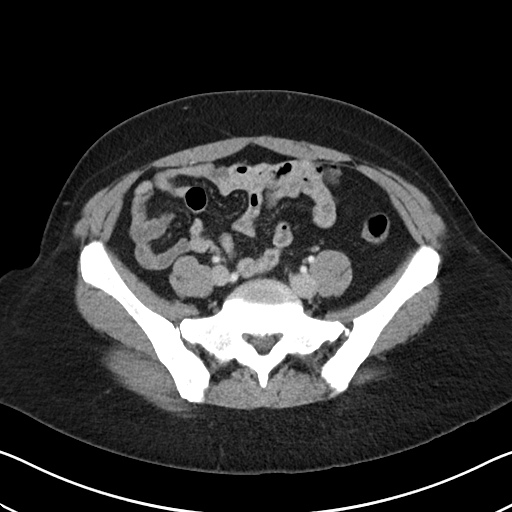
[im 43/98  soft-tissue]
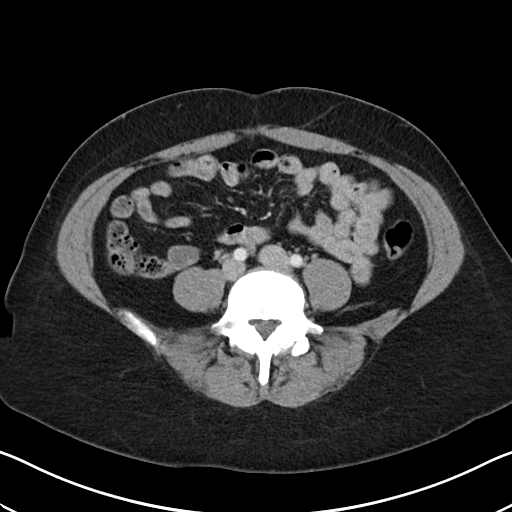
[im 55/98  soft-tissue]
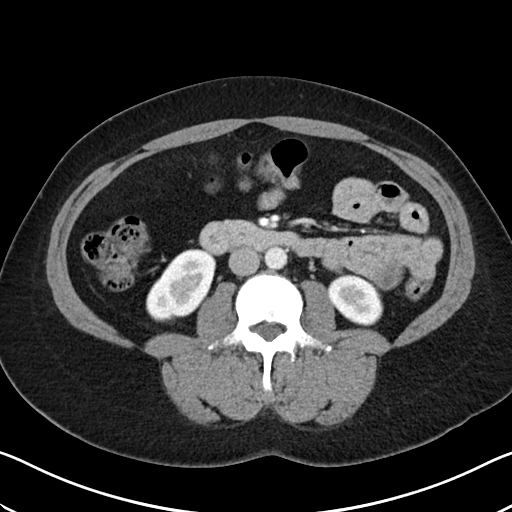
[im 61/98  soft-tissue]
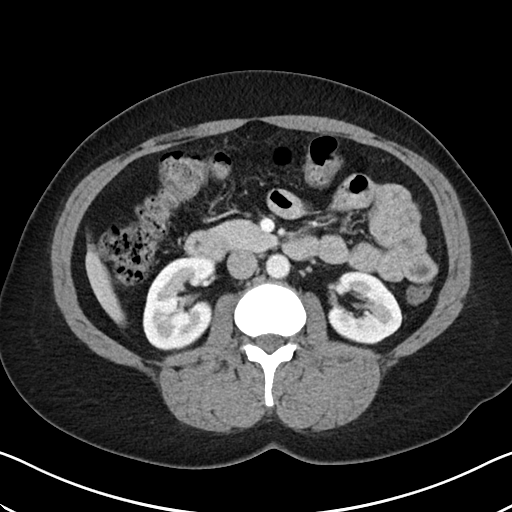
[im 67/98  soft-tissue]
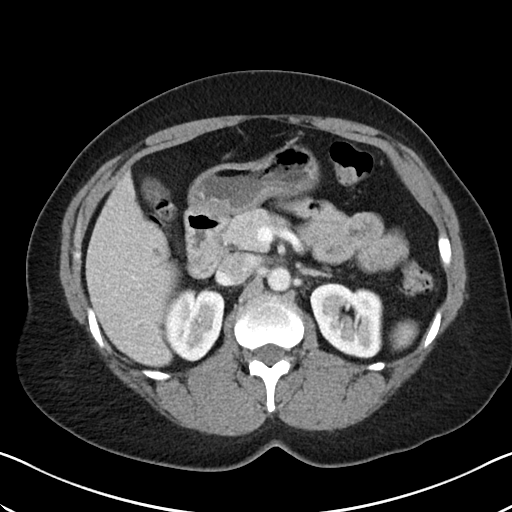
[im 67/98  bone]
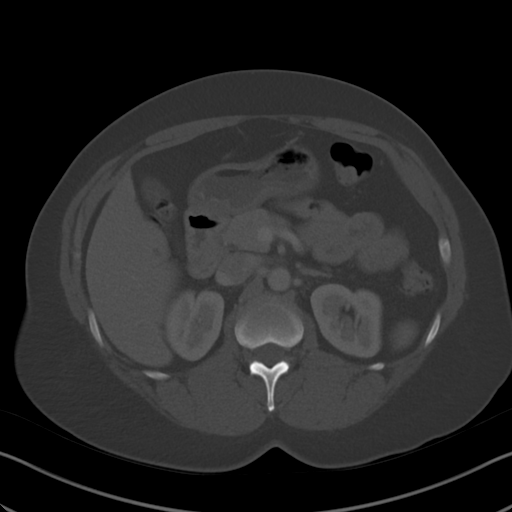
[im 73/98  soft-tissue]
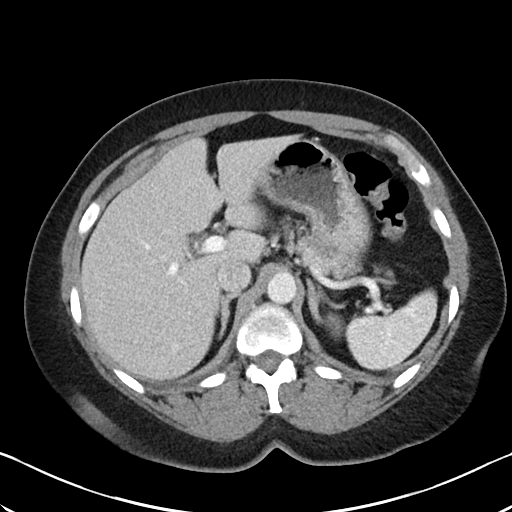
[im 85/98  soft-tissue]
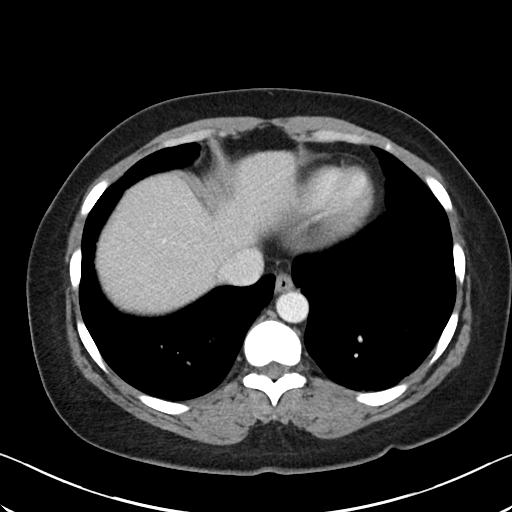
[im 91/98  soft-tissue]
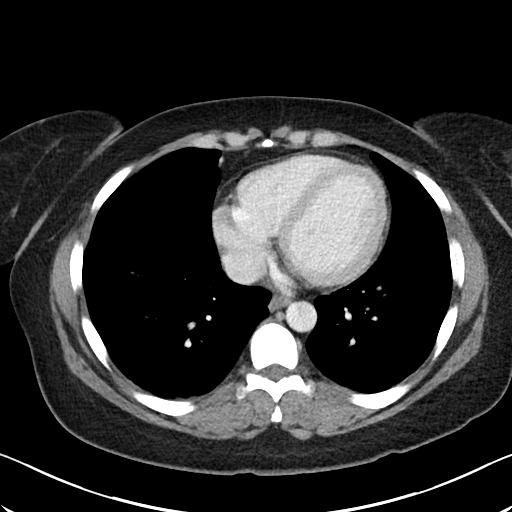

[Series 6: abdomen 3.0 mpr cor · coronal · 0.71mm/px · 3 of 96 slices shown]
[im 32/96  soft-tissue]
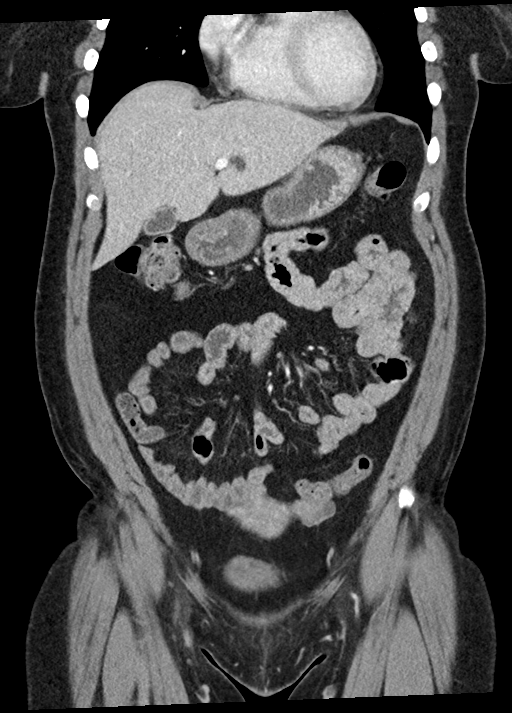
[im 43/96  soft-tissue]
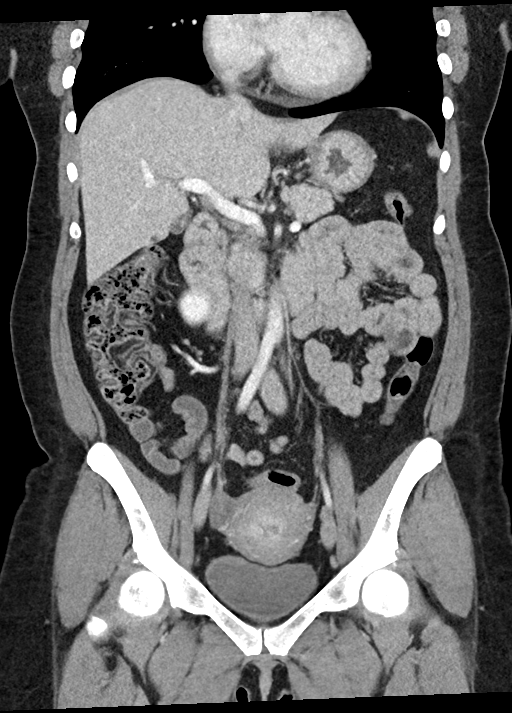
[im 53/96  soft-tissue]
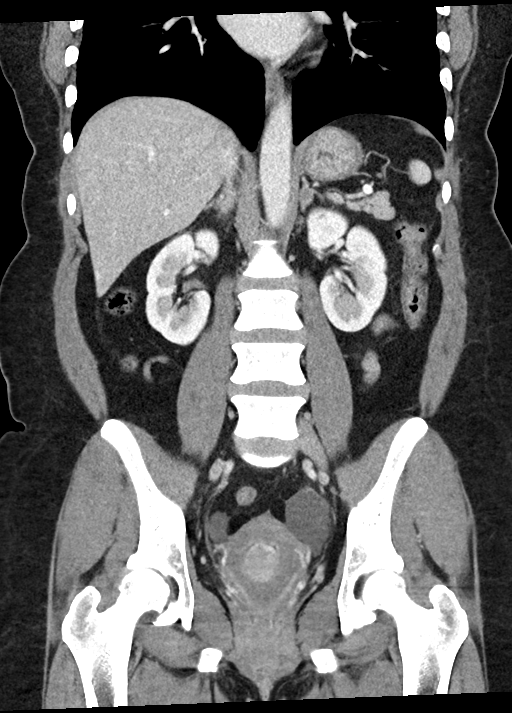

[15 of 46 positions shown; findings below may reference images not displayed]

FINDINGS: Lower chest: Mild linear scarring in the left lower lobe.

Hepatobiliary: Subcentimeter low-attenuation lesions between
segments 2 and 3 of the liver and in segment 5 of the liver, too
small to definitively characterize, but favored to represent tiny
cysts and/or biliary hamartomas. No other suspicious hepatic
lesions. No intra or extrahepatic biliary ductal dilatation.
Gallbladder is normal in appearance.

Pancreas: No pancreatic mass. No pancreatic ductal dilatation. No
pancreatic or peripancreatic fluid collections or inflammatory
changes.

Spleen: Unremarkable.

Adrenals/Urinary Tract: Bilateral kidneys and adrenal glands are
normal in appearance. No hydroureteronephrosis. Urinary bladder is
normal in appearance.

Stomach/Bowel: The appearance of the stomach is normal. No
pathologic dilatation of small bowel or colon. Normal appendix.

Vascular/Lymphatic: No significant atherosclerotic disease, aneurysm
or dissection noted in the abdominal or pelvic vasculature no
lymphadenopathy noted in the abdomen or pelvis.

Reproductive: There is a hypervascular lesion which appears
pedunculated extending from the endometrial canal into the lower
uterine segment likely partially prolapsing out of the cervical os
(axial image 80 of series 3 and sagittal image 63 of series 7)
estimated to measure approximately 5.8 x 2.5 x 3.5 cm, likely to
represent a large endometrial polyp. Ovaries are unremarkable in
appearance.

Other: Trace volume of free fluid in the cul-de-sac likely
physiologic. No larger volume of ascites. No pneumoperitoneum.

Musculoskeletal: There are no aggressive appearing lytic or blastic
lesions noted in the visualized portions of the skeleton.
IMPRESSION: 1. No acute findings are noted in the abdomen or pelvis to account
for the patient's symptoms.
2. Large lesion in the uterus suspicious for prolapsing endometrial
polyp. Further evaluation with pelvic ultrasound is recommended.
3. Small volume of free fluid in the cul-de-sac, presumably
physiologic.
4. Additional incidental findings, as above.

## 2021-08-01 ENCOUNTER — Encounter (HOSPITAL_COMMUNITY): Payer: Self-pay | Admitting: Emergency Medicine

## 2021-08-01 ENCOUNTER — Ambulatory Visit (HOSPITAL_COMMUNITY)
Admission: EM | Admit: 2021-08-01 | Discharge: 2021-08-01 | Disposition: A | Discharge status: Home / Self Care | Payer: 59 | Attending: Physician Assistant | Admitting: Physician Assistant

## 2021-08-01 DIAGNOSIS — M5442 Lumbago with sciatica, left side: Secondary | ICD-10-CM | POA: Diagnosis not present

## 2021-08-01 MED ORDER — KETOROLAC TROMETHAMINE 30 MG/ML IJ SOLN
INTRAMUSCULAR | Status: AC
  Filled 2021-08-01: qty 1

## 2021-08-01 MED ORDER — KETOROLAC TROMETHAMINE 30 MG/ML IJ SOLN
30.0000 mg | Freq: Once | INTRAMUSCULAR | Status: AC
  Administered 2021-08-01: 30 mg via INTRAMUSCULAR

## 2021-08-01 MED ORDER — NAPROXEN 500 MG PO TABS: 500.0000 mg | ORAL_TABLET | Freq: Two times a day (BID) | ORAL | 0 refills | Status: DC

## 2021-08-01 MED ORDER — TIZANIDINE HCL 4 MG PO TABS: 4.0000 mg | ORAL_TABLET | Freq: Three times a day (TID) | ORAL | 0 refills | Status: DC | PRN

## 2021-08-01 NOTE — Discharge Instructions (Signed)
We gave you an injection of Toradol today.  Please do not take NSAIDs including aspirin, ibuprofen/Advil, naproxen/Aleve for at least 12 hours.  You can use Tylenol for breakthrough pain.  Starting tomorrow (08/02/2021) you can take Naprosyn but should not take additional NSAIDs.  Use tizanidine/Zanaflex up to 3 times a day.  This will make you sleepy so do not drive or drink alcohol with taking it.  Make sure you rest and use heat for additional symptom relief.  If your symptoms or not improving please contact sports medicine to schedule an appointment to consider physical therapy or additional imaging.  If anything worsens and you have severe pain, difficulty walking, weakness in your legs, numbness or tingling, going to the bathroom on yourself without noticing it you need to go to the emergency room immediately.

## 2021-08-01 NOTE — ED Provider Notes (Signed)
[facility]    CSN: [id number] Arrival date & time: 08/01/21  1002      History   Chief Complaint Chief Complaint  Patient presents with   Back Pain    HPI Brenda Sweeney is a 51 y.o. female.   Patient presents today with a 6-day history of recurrent left lumbar back pain with radiation into her left buttock/leg.  She was seen for similar complaints on 03/29/2021 at which point x-ray was obtained that was normal.  She was prescribed Naprosyn and tizanidine which provided significant relief of symptoms until recurrence within the last week.  She denies any known injury or increase in activity prior to symptom onset but does have a physically demanding job.  She reports pain is rated 7 on a 0-10 pain scale, described as aching, worse with certain movements, no alleviating factors notified.  She denies any bowel/bladder incontinence, lower extremity weakness, saddle anesthesia.  Denies history of malignancy or previous spinal surgery.  Denies any urinary symptoms including dysuria, urinary frequency, urinary urgency.  She is confident that she is not pregnant.  She has not tried any over-the-counter medication for symptom management but has used Epsom salt soaks with minimal improvement.   Past Medical History:  Diagnosis Date   Medical history non-contributory     Patient Active Problem List   Diagnosis Date Noted   Dyslipidemia 02/05/2020   Prediabetes 02/05/2020   Tobacco use disorder 02/05/2020   Endometrial mass 12/19/2019   Abnormal uterine bleeding (AUB) 12/19/2019    Past Surgical History:  Procedure Laterality Date   DILATATION & CURETTAGE/HYSTEROSCOPY WITH MYOSURE N/A 12/19/2019   Procedure: DILATATION & CURETTAGE, HYSTEROSCOPY, MYOMECTOMY;  Surgeon: Osborne Oman, MD;  Location: [facility];  Service: Gynecology;  Laterality: N/A;   TUBAL LIGATION      OB History     Gravida  2   Para  2   Term  1   Preterm  1   AB       Living  2      SAB      IAB      Ectopic      Multiple      Live Births  2            Home Medications    Prior to Admission medications   Medication Sig Start Date End Date Taking? Authorizing Provider  BLACK ELDERBERRY,BERRY-FLOWER, PO Take 1 tablet by mouth.     [provider]  Multiple Vitamin (MULTIVITAMIN) capsule Take 1 capsule by mouth daily.    [provider]  naproxen (NAPROSYN) 500 MG tablet Take 1 tablet (500 mg total) by mouth 2 (two) times daily. 08/01/21   Wanette Robison, Derry Skill, PA-C  tiZANidine (ZANAFLEX) 4 MG tablet Take 1 tablet (4 mg total) by mouth every 8 (eight) hours as needed for muscle spasms. 08/01/21   Phylliss Strege, Derry Skill, PA-C    Family History Family History  Problem Relation Age of Onset   Hypertension Mother    Diabetes Mother    Diabetes Father    Hypertension Father    High Cholesterol Father    Diabetes Brother    Hypertension Brother    Diabetes Paternal Grandmother    Hypertension Paternal Grandmother     Social History Social History   Tobacco Use   Smoking status: Every Day    Packs/day: 0.25    Years: 30.00    Pack years: 7.50  Types: Cigarettes   Smokeless tobacco: Never  Vaping Use   Vaping Use: Never used  Substance Use Topics   Alcohol use: No   Drug use: No     Allergies   Prozac [fluoxetine hcl] and Prednisone   Review of Systems Review of Systems  Constitutional:  Positive for activity change. Negative for appetite change, fatigue and fever.  Genitourinary:  Negative for dysuria, frequency and urgency.  Musculoskeletal:  Positive for back pain. Negative for arthralgias and myalgias.  Neurological:  Negative for weakness and numbness.    Physical Exam Triage Vital Signs ED Triage Vitals [08/01/21 1026]  Enc Vitals Group     BP 140/82     Pulse Rate 73     Resp 18     Temp 98.2 F (36.8 C)     Temp Source Oral     SpO2 97 %     Weight      Height      Head Circumference       Peak Flow      Pain Score 7     Pain Loc      Pain Edu?      Excl. in [place]?    No data found.  Updated Vital Signs BP 140/82   Pulse 73   Temp 98.2 F (36.8 C) (Oral)   Resp 18   SpO2 97%   Visual Acuity Right Eye Distance:   Left Eye Distance:   Bilateral Distance:    Right Eye Near:   Left Eye Near:    Bilateral Near:     Physical Exam Vitals reviewed.  Constitutional:      General: She is awake. She is not in acute distress.    Appearance: Normal appearance. She is well-developed. She is not ill-appearing.     Comments: Very pleasant female appears stated age in no acute distress sitting on exam room table  HENT:     Head: Normocephalic and atraumatic.  Cardiovascular:     Rate and Rhythm: Normal rate and regular rhythm.     Heart sounds: Normal heart sounds, S1 normal and S2 normal. No murmur heard. Pulmonary:     Effort: Pulmonary effort is normal.     Breath sounds: Normal breath sounds. No wheezing, rhonchi or rales.     Comments: Clear to auscultation bilaterally Abdominal:     Palpations: Abdomen is soft.     Tenderness: There is no abdominal tenderness. There is no right CVA tenderness or left CVA tenderness.  Musculoskeletal:     Cervical back: No tenderness or bony tenderness.     Thoracic back: No tenderness or bony tenderness.     Lumbar back: Tenderness and bony tenderness present. Normal range of motion. Positive left straight leg raise test. Negative right straight leg raise test.  Psychiatric:        Behavior: Behavior is cooperative.     UC Treatments / Results  Labs (all labs ordered are listed, but only abnormal results are displayed) Labs Reviewed - No data to display  EKG   Radiology No results found.  Procedures Procedures (including critical care time)  Medications Ordered in UC Medications  ketorolac (TORADOL) 30 MG/ML injection 30 mg (has no administration in time range)    Initial Impression / Assessment and Plan /  UC Course  I have reviewed the triage vital signs and the nursing notes.  Pertinent labs & imaging results that were available during my care of the patient were  reviewed by me and considered in my medical decision making (see chart for details).     No alarm symptoms that warrant emergent evaluation or imaging.  Patient was given Toradol 30 mg in clinic with instruction not to take NSAIDs for 12 hours following this medication.  She was prescribed Naprosyn to start tomorrow (08/02/2021) with instruction not to take additional NSAIDs with this medication due to risk of GI bleeding.  Can use Tylenol for breakthrough pain.  She was given Zanaflex for additional symptom relief with instruction not to drive or drink alcohol with taking this medication.  Recommended conservative treatment measures including heat and rest.  If symptoms persist she is to follow-up with sports medicine was given contact information for local provider.  Discussed that if she has any alarm symptoms she needs to be seen immediately to which she expressed understanding.  Work excuse note provided.  Final Clinical Impressions(s) / UC Diagnoses   Final diagnoses:  Acute left-sided low back pain with left-sided sciatica     Discharge Instructions      We gave you an injection of Toradol today.  Please do not take NSAIDs including aspirin, ibuprofen/Advil, naproxen/Aleve for at least 12 hours.  You can use Tylenol for breakthrough pain.  Starting tomorrow (08/02/2021) you can take Naprosyn but should not take additional NSAIDs.  Use tizanidine/Zanaflex up to 3 times a day.  This will make you sleepy so do not drive or drink alcohol with taking it.  Make sure you rest and use heat for additional symptom relief.  If your symptoms or not improving please contact sports medicine to schedule an appointment to consider physical therapy or additional imaging.  If anything worsens and you have severe pain, difficulty walking, weakness in  your legs, numbness or tingling, going to the bathroom on yourself without noticing it you need to go to the emergency room immediately.     ED Prescriptions     Medication Sig Dispense Auth. Provider   tiZANidine (ZANAFLEX) 4 MG tablet Take 1 tablet (4 mg total) by mouth every 8 (eight) hours as needed for muscle spasms. 30 tablet Vikash Nest K, PA-C   naproxen (NAPROSYN) 500 MG tablet Take 1 tablet (500 mg total) by mouth 2 (two) times daily. 20 tablet Javar Eshbach, Derry Skill, PA-C      PDMP not reviewed this encounter.   Terrilee Croak, PA-C 08/01/21 1043

## 2021-08-01 NOTE — ED Triage Notes (Signed)
Pt is present today with lower back pain. Pt states that her pain started last Sunday. Pt denies any urinary sx or injury

## 2022-05-12 ENCOUNTER — Ambulatory Visit (HOSPITAL_COMMUNITY)
Admission: EM | Admit: 2022-05-12 | Discharge: 2022-05-12 | Disposition: A | Discharge status: Home / Self Care | Payer: 59 | Attending: Family Medicine | Admitting: Family Medicine

## 2022-05-12 ENCOUNTER — Encounter (HOSPITAL_COMMUNITY): Payer: Self-pay

## 2022-05-12 DIAGNOSIS — M7989 Other specified soft tissue disorders: Secondary | ICD-10-CM

## 2022-05-12 NOTE — Discharge Instructions (Signed)
Try wearing compression socks or compression stockings when you are working and on your feet Limit your salt intake and make sure you are drinking enough fluids.  Please return to be seen if the symptoms worsen in any way

## 2022-05-12 NOTE — ED Triage Notes (Signed)
Pt is here for right leg and swelling x 3days

## 2022-05-12 NOTE — ED Provider Notes (Signed)
[facility]    CSN: [id number] Arrival date & time: 05/12/22  1728      History   Chief Complaint Chief Complaint  Patient presents with   Leg Pain    HPI Tanisia A Sweeney is a 52 y.o. female.    Leg Pain  Here for right lower leg pain and swelling.  In the last week or so she noticed some discomfort in her right leg, and about 3 nights ago it work (she works as a Programme researcher, broadcasting/film/video at [employer]) she noted that her right lower leg was swollen compared to her left and that it was tender near the medial knee.  She elevated the leg overnight and the swelling completely resolved.  Then it has been coming back each day though not as swollen and not as painful.  No fever and no chest pain or shortness of breath.  She does not take any medications   On questioning she has been eating bacon more in the last week or 2 than she had prior to this. Past Medical History:  Diagnosis Date   Medical history non-contributory     Patient Active Problem List   Diagnosis Date Noted   Dyslipidemia 02/05/2020   Prediabetes 02/05/2020   Tobacco use disorder 02/05/2020   Endometrial mass 12/19/2019   Abnormal uterine bleeding (AUB) 12/19/2019    Past Surgical History:  Procedure Laterality Date   DILATATION & CURETTAGE/HYSTEROSCOPY WITH MYOSURE N/A 12/19/2019   Procedure: DILATATION & CURETTAGE, HYSTEROSCOPY, MYOMECTOMY;  Surgeon: Osborne Oman, MD;  Location: [facility];  Service: Gynecology;  Laterality: N/A;   TUBAL LIGATION      OB History     Gravida  2   Para  2   Term  1   Preterm  1   AB      Living  2      SAB      IAB      Ectopic      Multiple      Live Births  2            Home Medications    Prior to Admission medications   Medication Sig Start Date End Date Taking? Authorizing Provider  BLACK ELDERBERRY,BERRY-FLOWER, PO Take 1 tablet by mouth.    Yes [provider]  Multiple Vitamin  (MULTIVITAMIN) capsule Take 1 capsule by mouth daily.   Yes [provider]    Family History Family History  Problem Relation Age of Onset   Hypertension Mother    Diabetes Mother    Diabetes Father    Hypertension Father    High Cholesterol Father    Diabetes Brother    Hypertension Brother    Diabetes Paternal Grandmother    Hypertension Paternal Grandmother     Social History Social History   Tobacco Use   Smoking status: Every Day    Packs/day: 0.25    Years: 30.00    Total pack years: 7.50    Types: Cigarettes   Smokeless tobacco: Never  Vaping Use   Vaping Use: Never used  Substance Use Topics   Alcohol use: No   Drug use: No     Allergies   Prozac [fluoxetine hcl] and Prednisone   Review of Systems Review of Systems   Physical Exam Triage Vital Signs ED Triage Vitals  Enc Vitals Group     BP 05/12/22 1916 138/84     Pulse Rate 05/12/22 1916 69  Resp 05/12/22 1916 16     Temp 05/12/22 1916 98.3 F (36.8 C)     Temp Source 05/12/22 1916 Oral     SpO2 05/12/22 1916 95 %     Weight --      Height --      Head Circumference --      Peak Flow --      Pain Score 05/12/22 1912 3     Pain Loc --      Pain Edu? --      Excl. in [place]? --    No data found.  Updated Vital Signs BP 138/84 (BP Location: Left Arm)   Pulse 69   Temp 98.3 F (36.8 C) (Oral)   Resp 16   SpO2 95%   Visual Acuity Right Eye Distance:   Left Eye Distance:   Bilateral Distance:    Right Eye Near:   Left Eye Near:    Bilateral Near:     Physical Exam Vitals reviewed.  Constitutional:      General: She is not in acute distress.    Appearance: She is not ill-appearing, toxic-appearing or diaphoretic.  HENT:     Mouth/Throat:     Mouth: Mucous membranes are moist.  Eyes:     Extraocular Movements: Extraocular movements intact.     Conjunctiva/sclera: Conjunctivae normal.     Pupils: Pupils are equal, round, and reactive to light.  Cardiovascular:      Rate and Rhythm: Normal rate and regular rhythm.  Pulmonary:     Effort: Pulmonary effort is normal. No respiratory distress.     Breath sounds: Normal breath sounds. No stridor. No wheezing, rhonchi or rales.  Musculoskeletal:     Comments: There is no edema of the left leg.  There is a small amount of edema in her right lower leg above her sock, or above mid shin.  There is no erythema and there is no Homans or calf tenderness.  There is no erythema.  It is not tender at present over where she develops or was pain previously.'s are 2+ and equal  Skin:    Capillary Refill: Capillary refill takes less than 2 seconds.     Coloration: Skin is not pale.  Neurological:     General: No focal deficit present.     Mental Status: She is alert and oriented to person, place, and time.  Psychiatric:        Behavior: Behavior normal.      UC Treatments / Results  Labs (all labs ordered are listed, but only abnormal results are displayed) Labs Reviewed - No data to display  EKG   Radiology No results found.  Procedures Procedures (including critical care time)  Medications Ordered in UC Medications - No data to display  Initial Impression / Assessment and Plan / UC Course  I have reviewed the triage vital signs and the nursing notes.  Pertinent labs & imaging results that were available during my care of the patient were reviewed by me and considered in my medical decision making (see chart for details).        I think there is low likelihood that this is a DVT.  I think she is having some venous stasis, and her recent increase in salty food intake might be playing a part also. I have asked her to wear some compression socks or stockings, especially at work, and to limit her salt intake in the next week or 2.  She is  without health insurance at this time; we discussed possibly doing the venous Doppler, but we decided against doing this at this time.  If she worsens in any way she  will return to be seen and we can order the Doppler then.  Final Clinical Impressions(s) / UC Diagnoses   Final diagnoses:  None   Discharge Instructions   None    ED Prescriptions   None    PDMP not reviewed this encounter.   Barrett Henle, MD 05/12/22 [phone]

## 2023-02-03 ENCOUNTER — Encounter (HOSPITAL_COMMUNITY): Payer: Self-pay | Admitting: Emergency Medicine

## 2023-02-03 ENCOUNTER — Ambulatory Visit (HOSPITAL_COMMUNITY)
Admission: EM | Admit: 2023-02-03 | Discharge: 2023-02-03 | Disposition: A | Discharge status: Home / Self Care | Payer: 59 | Attending: Emergency Medicine | Admitting: Emergency Medicine

## 2023-02-03 DIAGNOSIS — J019 Acute sinusitis, unspecified: Secondary | ICD-10-CM

## 2023-02-03 DIAGNOSIS — B9789 Other viral agents as the cause of diseases classified elsewhere: Secondary | ICD-10-CM | POA: Diagnosis not present

## 2023-02-03 DIAGNOSIS — J069 Acute upper respiratory infection, unspecified: Secondary | ICD-10-CM

## 2023-02-03 LAB — POC COVID19/FLU A&B COMBO
Covid Antigen, POC: NEGATIVE
Influenza A Antigen, POC: NEGATIVE
Influenza B Antigen, POC: NEGATIVE

## 2023-02-03 MED ORDER — FLUTICASONE PROPIONATE 50 MCG/ACT NA SUSP: 1.0000 | Freq: Every day | NASAL | 2 refills | Status: DC

## 2023-02-03 MED ORDER — LEVOCETIRIZINE DIHYDROCHLORIDE 5 MG PO TABS: 5.0000 mg | ORAL_TABLET | Freq: Every evening | ORAL | 1 refills | Status: DC

## 2023-02-03 NOTE — ED Provider Notes (Signed)
[facility]    CSN: [id number] Arrival date & time: 02/03/23  1417    HISTORY  No chief complaint on file.  HPI Brenda Sweeney is a pleasant, 52 y.o. female who presents to urgent care today. Patient complains of a 5-day history of nasal congestion, sinus pressure, headache, rhinorrhea and nonproductive cough.  Patient states that she does not feel like herself.  Patient states she has been drinking tea and taking Tylenol Cold and flu with intermittent relief of symptoms.  Patient denies nausea, vomiting, diarrhea, fever, body aches, chills.  Patient has normal vital signs on arrival today.  The history is provided by the patient.   Past Medical History:  Diagnosis Date   Medical history non-contributory    Patient Active Problem List   Diagnosis Date Noted   Dyslipidemia 02/05/2020   Prediabetes 02/05/2020   Tobacco use disorder 02/05/2020   Endometrial mass 12/19/2019   Abnormal uterine bleeding (AUB) 12/19/2019   Past Surgical History:  Procedure Laterality Date   DILATATION & CURETTAGE/HYSTEROSCOPY WITH MYOSURE N/A 12/19/2019   Procedure: DILATATION & CURETTAGE, HYSTEROSCOPY, MYOMECTOMY;  Surgeon: Tereso Newcomer, MD;  Location: [facility];  Service: Gynecology;  Laterality: N/A;   TUBAL LIGATION     OB History     Gravida  2   Para  2   Term  1   Preterm  1   AB      Living  2      SAB      IAB      Ectopic      Multiple      Live Births  2          Home Medications    Prior to Admission medications   Medication Sig Start Date End Date Taking? Authorizing Provider  BLACK ELDERBERRY,BERRY-FLOWER, PO Take 1 tablet by mouth.     [provider]  Multiple Vitamin (MULTIVITAMIN) capsule Take 1 capsule by mouth daily.    [provider]    Family History Family History  Problem Relation Age of Onset   Hypertension Mother    Diabetes Mother    Diabetes Father    Hypertension  Father    High Cholesterol Father    Diabetes Brother    Hypertension Brother    Diabetes Paternal Grandmother    Hypertension Paternal Grandmother    Social History Social History   Tobacco Use   Smoking status: Every Day    Current packs/day: 0.25    Average packs/day: 0.3 packs/day for 30.0 years (7.5 ttl pk-yrs)    Types: Cigarettes   Smokeless tobacco: Never  Vaping Use   Vaping status: Never Used  Substance Use Topics   Alcohol use: No   Drug use: No   Allergies   Prozac [fluoxetine hcl] and Prednisone  Review of Systems Review of Systems Pertinent findings revealed after performing a 14 point review of systems has been noted in the history of present illness.  Physical Exam Vital Signs BP (!) 149/85 (BP Location: Right Arm)   Pulse 74   Temp 98.2 F (36.8 C) (Oral)   Resp 17   SpO2 97%   No data found.  Physical Exam Vitals and nursing note reviewed.  Constitutional:      General: She is not in acute distress.    Appearance: Normal appearance. She is not ill-appearing.  HENT:     Head: Normocephalic and atraumatic.     Salivary Glands: Right  salivary gland is not diffusely enlarged or tender. Left salivary gland is not diffusely enlarged or tender.     Right Ear: Hearing, ear canal and external ear normal. No drainage. No middle ear effusion. There is no impacted cerumen. Tympanic membrane is erythematous. Tympanic membrane is not injected or bulging.     Left Ear: Hearing, ear canal and external ear normal. No drainage.  No middle ear effusion. There is no impacted cerumen. Tympanic membrane is erythematous. Tympanic membrane is not injected or bulging.     Nose: Congestion and rhinorrhea present. No nasal deformity, septal deviation or mucosal edema. Rhinorrhea is clear.     Right Turbinates: Enlarged and swollen. Not pale.     Left Turbinates: Enlarged and swollen. Not pale.     Right Sinus: No maxillary sinus tenderness or frontal sinus tenderness.      Left Sinus: No maxillary sinus tenderness or frontal sinus tenderness.     Mouth/Throat:     Lips: Pink. No lesions.     Mouth: Mucous membranes are moist. No oral lesions.     Pharynx: Uvula midline. Pharyngeal swelling and postnasal drip present. No oropharyngeal exudate, posterior oropharyngeal erythema or uvula swelling.     Tonsils: No tonsillar exudate. 0 on the right. 0 on the left.  Eyes:     General: Lids are normal.        Right eye: No discharge.        Left eye: No discharge.     Extraocular Movements: Extraocular movements intact.     Conjunctiva/sclera: Conjunctivae normal.     Right eye: Right conjunctiva is not injected.     Left eye: Left conjunctiva is not injected.     Pupils: Pupils are equal, round, and reactive to light.  Neck:     Trachea: Trachea and phonation normal.  Cardiovascular:     Rate and Rhythm: Normal rate and regular rhythm.     Pulses: Normal pulses.     Heart sounds: Normal heart sounds, S1 normal and S2 normal. No murmur heard.    No friction rub. No gallop.  Pulmonary:     Effort: Pulmonary effort is normal. No accessory muscle usage, prolonged expiration or respiratory distress.     Breath sounds: Normal breath sounds. No stridor, decreased air movement or transmitted upper airway sounds. No decreased breath sounds, wheezing, rhonchi or rales.  Chest:     Chest wall: No tenderness.  Musculoskeletal:        General: Normal range of motion.     Cervical back: Full passive range of motion without pain, normal range of motion and neck supple. Normal range of motion.  Lymphadenopathy:     Cervical: No cervical adenopathy.  Skin:    General: Skin is warm and dry.     Findings: No erythema or rash.  Neurological:     General: No focal deficit present.     Mental Status: She is alert and oriented to person, place, and time.  Psychiatric:        Mood and Affect: Mood normal.        Behavior: Behavior normal.    Visual Acuity Right Eye  Distance:   Left Eye Distance:   Bilateral Distance:    Right Eye Near:   Left Eye Near:    Bilateral Near:     UC Couse / Diagnostics / Procedures:     Radiology No results found.  Procedures Procedures (including critical care time) EKG  Pending  results:  Labs Reviewed  POC COVID19/FLU A&B COMBO    Medications Ordered in UC: Medications - No data to display  UC Diagnoses / Final Clinical Impressions(s)   I have reviewed the triage vital signs and the nursing notes.  Pertinent labs & imaging results that were available during my care of the patient were reviewed by me and considered in my medical decision making (see chart for details).    Final diagnoses:  Acute viral sinusitis  Viral upper respiratory illness   Rapid flu test was negative, rapid COVID-19 test was also negative, patient advised.  Patient advised to consider repeat testing for COVID-19 in 3 days if not feeling any better.  Patient advised likely suffering from with many viruses circulating in our community right now.  Conservative care recommended.  Recommend nasal steroid spray and antihistamine as well as ibuprofen for relief of symptoms.  Conservative care recommended.  Return precautions advised.  Please see discharge instructions below for details of plan of care as provided to patient. ED Prescriptions     Medication Sig Dispense Auth. Provider   fluticasone (FLONASE) 50 MCG/ACT nasal spray Place 1 spray into both nostrils daily. Begin by using 2 sprays in each nare daily for 3 to 5 days, then decrease to 1 spray in each nare daily. 15.8 mL Theadora Rama Scales, PA-C   levocetirizine (XYZAL) 5 MG tablet Take 1 tablet (5 mg total) by mouth every evening. 90 tablet Theadora Rama Scales, [place]      PDMP not reviewed this encounter.  Pending results:  Labs Reviewed  POC COVID19/FLU A&B COMBO    Discharge Instructions:   Discharge Instructions      Your rapid influenza antigen test  today was negative.  No further influenza testing is indicated.     Your COVID-19 test was negative.  Please consider retesting in the next 2 to 3 days, particularly if you are not feeling any better.  You are welcome to return here to urgent care to have it done or you can take a home COVID-19 test.     If both your COVID-19 tests are negative, then you can safely assume that your illness is due to one of the many less serious illnesses circulating in our community right now.     Conservative care is recommended with rest, drinking plenty of clear fluids, eating only when hungry, taking supportive medications for your symptoms and avoiding being around other people.  Please remain at home until you are fever free for 24 hours without the use of antifever medications such as Tylenol and ibuprofen.    Please read below to learn more about the medications, dosages and frequencies that I recommend to help alleviate your symptoms and to get you feeling better soon:   Xyzak (levocetirizine): This is an excellent second-generation antihistamine that helps to reduce respiratory inflammatory response to environmental allergens.  In some patients, this medication can cause daytime sleepiness so I recommend that you take 1 tablet daily at bedtime.     Flonase (fluticasone): This is a steroid nasal spray that used once daily, 1 spray in each nare.  This works best when used on a daily basis. This medication does not work well if it is only used when you think you need it.  After 3 to 5 days of use, you will notice significant reduction of the inflammation and mucus production that is currently being caused by exposure to allergens, whether seasonal or environmental.  The most common  side effect of this medication is nosebleeds.  If you experience a nosebleed, please discontinue use for 1 week, then feel free to resume.  If you find that your insurance will not pay for this medication, please consider a different  nasal steroids such as Nasonex (mometasone), or Nasacort (triamcinolone).   Advil, Motrin (ibuprofen): This is a good anti-inflammatory medication which addresses aches, pains and inflammation of the upper airways that causes sinus and nasal congestion as well as in the lower airways which makes your cough feel tight and sometimes burn.  I recommend that you take between 400 to 600 mg every 6-8 hours as needed.  Please do not take more than 2400 mg of ibuprofen in a 24-hour period and please do not take high doses of ibuprofen for more than 3 days in a row as this can lead to stomach ulcers.   If symptoms have not meaningfully improved in the next 3 to 5 days, please return for repeat evaluation or follow-up with your regular provider.  If symptoms have worsened in the next 3 to 5 days, please return for repeat evaluation or follow-up with your regular provider.    Thank you for visiting urgent care today.  We appreciate the opportunity to participate in your care.       Disposition Upon Discharge:  Condition: stable for discharge home  Patient presented with an acute illness with associated systemic symptoms and significant discomfort requiring urgent management. In my opinion, this is a condition that a prudent lay person (someone who possesses an average knowledge of health and medicine) may potentially expect to result in complications if not addressed urgently such as respiratory distress, impairment of bodily function or dysfunction of bodily organs.   Routine symptom specific, illness specific and/or disease specific instructions were discussed with the patient and/or caregiver at length.   As such, the patient has been evaluated and assessed, work-up was performed and treatment was provided in alignment with urgent care protocols and evidence based medicine.  Patient/parent/caregiver has been advised that the patient may require follow up for further testing and treatment if the symptoms  continue in spite of treatment, as clinically indicated and appropriate.  Patient/parent/caregiver has been advised to return to the [facility] or PCP if no better; to PCP or the Emergency Department if new signs and symptoms develop, or if the current signs or symptoms continue to change or worsen for further workup, evaluation and treatment as clinically indicated and appropriate  The patient will follow up with their current PCP if and as advised. If the patient does not currently have a PCP we will assist them in obtaining one.   The patient may need specialty follow up if the symptoms continue, in spite of conservative treatment and management, for further workup, evaluation, consultation and treatment as clinically indicated and appropriate.  Patient/parent/caregiver verbalized understanding and agreement of plan as discussed.  All questions were addressed during visit.  Please see discharge instructions below for further details of plan.  This office note has been dictated using Teaching laboratory technician.  Unfortunately, this method of dictation can sometimes lead to typographical or grammatical errors.  I apologize for your inconvenience in advance if this occurs.  Please do not hesitate to reach out to me if clarification is needed.      Theadora Rama Scales, PA-C 02/03/23 [phone]

## 2023-02-03 NOTE — Discharge Instructions (Addendum)
Your rapid influenza antigen test today was negative.  No further influenza testing is indicated.     Your COVID-19 test was negative.  Please consider retesting in the next 2 to 3 days, particularly if you are not feeling any better.  You are welcome to return here to urgent care to have it done or you can take a home COVID-19 test.     If both your COVID-19 tests are negative, then you can safely assume that your illness is due to one of the many less serious illnesses circulating in our community right now.     Conservative care is recommended with rest, drinking plenty of clear fluids, eating only when hungry, taking supportive medications for your symptoms and avoiding being around other people.  Please remain at home until you are fever free for 24 hours without the use of antifever medications such as Tylenol and ibuprofen.    Please read below to learn more about the medications, dosages and frequencies that I recommend to help alleviate your symptoms and to get you feeling better soon:   Xyzak (levocetirizine): This is an excellent second-generation antihistamine that helps to reduce respiratory inflammatory response to environmental allergens.  In some patients, this medication can cause daytime sleepiness so I recommend that you take 1 tablet daily at bedtime.     Flonase (fluticasone): This is a steroid nasal spray that used once daily, 1 spray in each nare.  This works best when used on a daily basis. This medication does not work well if it is only used when you think you need it.  After 3 to 5 days of use, you will notice significant reduction of the inflammation and mucus production that is currently being caused by exposure to allergens, whether seasonal or environmental.  The most common side effect of this medication is nosebleeds.  If you experience a nosebleed, please discontinue use for 1 week, then feel free to resume.  If you find that your insurance will not pay for this  medication, please consider a different nasal steroids such as Nasonex (mometasone), or Nasacort (triamcinolone).   Advil, Motrin (ibuprofen): This is a good anti-inflammatory medication which addresses aches, pains and inflammation of the upper airways that causes sinus and nasal congestion as well as in the lower airways which makes your cough feel tight and sometimes burn.  I recommend that you take between 400 to 600 mg every 6-8 hours as needed.  Please do not take more than 2400 mg of ibuprofen in a 24-hour period and please do not take high doses of ibuprofen for more than 3 days in a row as this can lead to stomach ulcers.   If symptoms have not meaningfully improved in the next 3 to 5 days, please return for repeat evaluation or follow-up with your regular provider.  If symptoms have worsened in the next 3 to 5 days, please return for repeat evaluation or follow-up with your regular provider.    Thank you for visiting urgent care today.  We appreciate the opportunity to participate in your care.

## 2023-02-03 NOTE — ED Triage Notes (Signed)
Pt c/o cough, congestion, sinus pressure, and no taste since Saturday.   She has tried tea, tylenol cold/ flu

## 2023-07-05 ENCOUNTER — Encounter (HOSPITAL_COMMUNITY): Payer: Self-pay | Admitting: *Deleted

## 2023-07-05 ENCOUNTER — Other Ambulatory Visit: Payer: Self-pay

## 2023-07-05 ENCOUNTER — Inpatient Hospital Stay (HOSPITAL_COMMUNITY)
Admission: EM | Admit: 2023-07-05 | Discharge: 2023-07-07 | DRG: 683 | Disposition: A | Discharge status: Home / Self Care | Attending: Internal Medicine | Admitting: Internal Medicine

## 2023-07-05 ENCOUNTER — Emergency Department (HOSPITAL_COMMUNITY)

## 2023-07-05 ENCOUNTER — Ambulatory Visit (HOSPITAL_COMMUNITY)
Admission: EM | Admit: 2023-07-05 | Discharge: 2023-07-05 | Disposition: A | Discharge status: Home / Self Care | Attending: Emergency Medicine | Admitting: Emergency Medicine

## 2023-07-05 DIAGNOSIS — R252 Cramp and spasm: Secondary | ICD-10-CM | POA: Diagnosis present

## 2023-07-05 DIAGNOSIS — N179 Acute kidney failure, unspecified: Principal | ICD-10-CM | POA: Diagnosis present

## 2023-07-05 DIAGNOSIS — E860 Dehydration: Secondary | ICD-10-CM | POA: Diagnosis present

## 2023-07-05 DIAGNOSIS — E66811 Obesity, class 1: Secondary | ICD-10-CM | POA: Diagnosis present

## 2023-07-05 DIAGNOSIS — R000 Tachycardia, unspecified: Secondary | ICD-10-CM | POA: Diagnosis not present

## 2023-07-05 DIAGNOSIS — R7989 Other specified abnormal findings of blood chemistry: Secondary | ICD-10-CM | POA: Diagnosis not present

## 2023-07-05 DIAGNOSIS — R197 Diarrhea, unspecified: Secondary | ICD-10-CM | POA: Diagnosis not present

## 2023-07-05 DIAGNOSIS — I514 Myocarditis, unspecified: Secondary | ICD-10-CM | POA: Diagnosis present

## 2023-07-05 DIAGNOSIS — Z6830 Body mass index (BMI) 30.0-30.9, adult: Secondary | ICD-10-CM

## 2023-07-05 DIAGNOSIS — I2510 Atherosclerotic heart disease of native coronary artery without angina pectoris: Secondary | ICD-10-CM | POA: Diagnosis present

## 2023-07-05 DIAGNOSIS — R55 Syncope and collapse: Secondary | ICD-10-CM

## 2023-07-05 DIAGNOSIS — R7303 Prediabetes: Secondary | ICD-10-CM | POA: Diagnosis present

## 2023-07-05 DIAGNOSIS — D751 Secondary polycythemia: Secondary | ICD-10-CM | POA: Diagnosis present

## 2023-07-05 DIAGNOSIS — E785 Hyperlipidemia, unspecified: Secondary | ICD-10-CM | POA: Diagnosis present

## 2023-07-05 DIAGNOSIS — R778 Other specified abnormalities of plasma proteins: Secondary | ICD-10-CM | POA: Diagnosis not present

## 2023-07-05 DIAGNOSIS — I10 Essential (primary) hypertension: Secondary | ICD-10-CM | POA: Diagnosis not present

## 2023-07-05 DIAGNOSIS — Z888 Allergy status to other drugs, medicaments and biological substances status: Secondary | ICD-10-CM

## 2023-07-05 DIAGNOSIS — K529 Noninfective gastroenteritis and colitis, unspecified: Secondary | ICD-10-CM | POA: Diagnosis present

## 2023-07-05 DIAGNOSIS — Z833 Family history of diabetes mellitus: Secondary | ICD-10-CM

## 2023-07-05 DIAGNOSIS — R079 Chest pain, unspecified: Secondary | ICD-10-CM

## 2023-07-05 DIAGNOSIS — N83201 Unspecified ovarian cyst, right side: Secondary | ICD-10-CM | POA: Diagnosis present

## 2023-07-05 DIAGNOSIS — I160 Hypertensive urgency: Secondary | ICD-10-CM | POA: Diagnosis present

## 2023-07-05 DIAGNOSIS — F17210 Nicotine dependence, cigarettes, uncomplicated: Secondary | ICD-10-CM | POA: Diagnosis present

## 2023-07-05 DIAGNOSIS — Z83438 Family history of other disorder of lipoprotein metabolism and other lipidemia: Secondary | ICD-10-CM

## 2023-07-05 DIAGNOSIS — I2489 Other forms of acute ischemic heart disease: Secondary | ICD-10-CM | POA: Diagnosis present

## 2023-07-05 DIAGNOSIS — R42 Dizziness and giddiness: Secondary | ICD-10-CM

## 2023-07-05 DIAGNOSIS — Z8249 Family history of ischemic heart disease and other diseases of the circulatory system: Secondary | ICD-10-CM

## 2023-07-05 DIAGNOSIS — N83209 Unspecified ovarian cyst, unspecified side: Secondary | ICD-10-CM

## 2023-07-05 LAB — COMPREHENSIVE METABOLIC PANEL WITH GFR
ALT: 19 U/L (ref 0–44)
AST: 23 U/L (ref 15–41)
Albumin: 4.5 g/dL (ref 3.5–5.0)
Alkaline Phosphatase: 84 U/L (ref 38–126)
Anion gap: 13 (ref 5–15)
BUN: 17 mg/dL (ref 6–20)
CO2: 22 mmol/L (ref 22–32)
Calcium: 10 mg/dL (ref 8.9–10.3)
Chloride: 100 mmol/L (ref 98–111)
Creatinine, Ser: 1.6 mg/dL — ABNORMAL HIGH (ref 0.44–1.00)
GFR, Estimated: 38 mL/min — ABNORMAL LOW (ref >60)
Glucose, Bld: 97 mg/dL (ref 70–99)
Potassium: 4 mmol/L (ref 3.5–5.1)
Sodium: 135 mmol/L (ref 135–145)
Total Bilirubin: 0.5 mg/dL (ref 0.0–1.2)
Total Protein: 8.3 g/dL — ABNORMAL HIGH (ref 6.5–8.1)

## 2023-07-05 LAB — CBC WITH DIFFERENTIAL/PLATELET
Abs Immature Granulocytes: 0.1 10*3/uL — ABNORMAL HIGH (ref 0.00–0.07)
Basophils Absolute: 0.1 10*3/uL (ref 0.0–0.1)
Basophils Relative: 0 %
Eosinophils Absolute: 0 10*3/uL (ref 0.0–0.5)
Eosinophils Relative: 0 %
HCT: 50.6 % — ABNORMAL HIGH (ref 36.0–46.0)
Hemoglobin: 17.1 g/dL — ABNORMAL HIGH (ref 12.0–15.0)
Immature Granulocytes: 1 %
Lymphocytes Relative: 16 %
Lymphs Abs: 2.6 10*3/uL (ref 0.7–4.0)
MCH: 32.8 pg (ref 26.0–34.0)
MCHC: 33.8 g/dL (ref 30.0–36.0)
MCV: 96.9 fL (ref 80.0–100.0)
Monocytes Absolute: 1.2 10*3/uL — ABNORMAL HIGH (ref 0.1–1.0)
Monocytes Relative: 7 %
Neutro Abs: 12.4 10*3/uL — ABNORMAL HIGH (ref 1.7–7.7)
Neutrophils Relative %: 76 %
Platelets: 327 10*3/uL (ref 150–400)
RBC: 5.22 MIL/uL — ABNORMAL HIGH (ref 3.87–5.11)
RDW: 13.5 % (ref 11.5–15.5)
WBC: 16.3 10*3/uL — ABNORMAL HIGH (ref 4.0–10.5)
nRBC: 0 % (ref 0.0–0.2)

## 2023-07-05 LAB — MAGNESIUM: Magnesium: 1.8 mg/dL (ref 1.7–2.4)

## 2023-07-05 LAB — POCT FASTING CBG KUC MANUAL ENTRY: POCT Glucose (KUC): 147 mg/dL — AB (ref 70–99)

## 2023-07-05 LAB — TROPONIN I (HIGH SENSITIVITY)
Troponin I (High Sensitivity): 71 ng/L — ABNORMAL HIGH (ref <18)
Troponin I (High Sensitivity): 79 ng/L — ABNORMAL HIGH (ref <18)
Troponin I (High Sensitivity): 92 ng/L — ABNORMAL HIGH (ref <18)

## 2023-07-05 LAB — TSH: TSH: 2.317 u[IU]/mL (ref 0.350–4.500)

## 2023-07-05 MED ORDER — IOHEXOL 350 MG/ML SOLN
100.0000 mL | Freq: Once | INTRAVENOUS | Status: AC | PRN
  Administered 2023-07-05: 100 mL via INTRAVENOUS

## 2023-07-05 MED ORDER — LACTATED RINGERS IV BOLUS
500.0000 mL | Freq: Once | INTRAVENOUS | Status: AC
  Administered 2023-07-05: 500 mL via INTRAVENOUS

## 2023-07-05 NOTE — ED Triage Notes (Signed)
 Pt states that she felt like she was going to pass out at work.  She was seen at [facility] and told to come here for further evaluation.  No CP or sob with this.   Pt is describing cramping in hands also, appears anxious.

## 2023-07-05 NOTE — ED Provider Notes (Addendum)
 [facility]    CSN: [id number] Arrival date & time: 07/05/23  1231      History   Chief Complaint Chief Complaint  Patient presents with   Spasms    HPI Brenda Sweeney is a 53 y.o. female.   Patient presents to urgent care after near syncopal episode while at work today. Patient states that she is a Engineer, petroleum at [employer] and while she was working earlier this morning she had an episode where she broke out in a sweat, became very weak, and felt like she was going to pass out.  Patient states that her coworker told her that the color had drained from her face during this episode.  Patient states that since that episode she has had intermittent cramping in her hands into her right foot.  Patient states that her fingers will cramp up and get stuck in one place for a few minutes and then it subsides.  Patient states that she did have diarrhea that began last night and reports approximately 5 episodes of diarrhea since then.  Denies any blood in stool, abdominal pain, vomiting, chest pain, dizziness, numbness, confusion, slurred speech, and fever.  Patient denies history of high blood pressure and denies taking any medications on a regular basis.  Patient states that she does have a history of being prediabetic, but has never been diagnosed with diabetes.     History reviewed. No pertinent past medical history.  Patient Active Problem List   Diagnosis Date Noted   Dyslipidemia 02/05/2020   Prediabetes 02/05/2020   Tobacco use disorder 02/05/2020   Endometrial mass 12/19/2019   Abnormal uterine bleeding (AUB) 12/19/2019    Past Surgical History:  Procedure Laterality Date   DILATATION & CURETTAGE/HYSTEROSCOPY WITH MYOSURE N/A 12/19/2019   Procedure: DILATATION & CURETTAGE, HYSTEROSCOPY, MYOMECTOMY;  Surgeon: Julianne Octave, MD;  Location: [facility];  Service: Gynecology;  Laterality: N/A;   TUBAL LIGATION       OB History     Gravida  2   Para  2   Term  1   Preterm  1   AB      Living  2      SAB      IAB      Ectopic      Multiple      Live Births  2            Home Medications    Prior to Admission medications   Medication Sig Start Date End Date Taking? Authorizing Provider  BLACK ELDERBERRY,BERRY-FLOWER, PO Take 1 tablet by mouth.     [provider]  levocetirizine (XYZAL ) 5 MG tablet Take 1 tablet (5 mg total) by mouth every evening. 02/03/23 08/02/23  Eloise Hake Scales, PA-C  Multiple Vitamin (MULTIVITAMIN) capsule Take 1 capsule by mouth daily.    [provider]    Family History Family History  Problem Relation Age of Onset   Hypertension Mother    Diabetes Mother    Diabetes Father    Hypertension Father    High Cholesterol Father    Diabetes Brother    Hypertension Brother    Diabetes Paternal Grandmother    Hypertension Paternal Grandmother     Social History Social History   Tobacco Use   Smoking status: Every Day    Current packs/day: 0.25    Average packs/day: 0.3 packs/day for 30.0 years (7.5 ttl pk-yrs)    Types: Cigarettes  Smokeless tobacco: Never  Vaping Use   Vaping status: Never Used  Substance Use Topics   Alcohol use: No   Drug use: No     Allergies   Prozac [fluoxetine hcl] and Prednisone   Review of Systems Review of Systems  Per HPI  Physical Exam Triage Vital Signs ED Triage Vitals  Encounter Vitals Group     BP 07/05/23 1323 (!) 155/115     Systolic BP Percentile --      Diastolic BP Percentile --      Pulse Rate 07/05/23 1323 100     Resp 07/05/23 1323 17     Temp 07/05/23 1323 98.1 F (36.7 C)     Temp Source 07/05/23 1323 Oral     SpO2 07/05/23 1323 100 %     Weight --      Height --      Head Circumference --      Peak Flow --      Pain Score 07/05/23 1321 0     Pain Loc --      Pain Education --      Exclude from Growth Chart --    No data found.  Updated  Vital Signs BP (!) 155/115 (BP Location: Right Arm)   Pulse 100   Temp 98.1 F (36.7 C) (Oral)   Resp 17   SpO2 100%   Visual Acuity Right Eye Distance:   Left Eye Distance:   Bilateral Distance:    Right Eye Near:   Left Eye Near:    Bilateral Near:     Physical Exam Vitals and nursing note reviewed.  Constitutional:      General: She is awake. She is not in acute distress.    Appearance: Normal appearance. She is well-developed and well-groomed. She is diaphoretic. She is not ill-appearing.  Cardiovascular:     Rate and Rhythm: Regular rhythm. Tachycardia present.     Pulses: Normal pulses.     Heart sounds: Normal heart sounds.  Pulmonary:     Effort: Pulmonary effort is normal.     Breath sounds: Normal breath sounds.  Abdominal:     General: Abdomen is flat. Bowel sounds are normal. There is no distension.     Palpations: Abdomen is soft.     Tenderness: There is no abdominal tenderness. There is no guarding or rebound.  Musculoskeletal:        General: Normal range of motion.  Skin:    General: Skin is warm.  Neurological:     General: No focal deficit present.     Mental Status: She is alert and oriented to person, place, and time. Mental status is at baseline.     GCS: GCS eye subscore is 4. GCS verbal subscore is 5. GCS motor subscore is 6.     Cranial Nerves: Cranial nerves 2-12 are intact.     Sensory: Sensation is intact.     Motor: Motor function is intact.     Coordination: Coordination is intact.     Gait: Gait is intact.  Psychiatric:        Behavior: Behavior is cooperative.      UC Treatments / Results  Labs (all labs ordered are listed, but only abnormal results are displayed) Labs Reviewed  POCT FASTING CBG [facility] MANUAL ENTRY - Abnormal; Notable for the following components:      Result Value   POCT Glucose ([facility]) 147 (*)    All other components within normal limits  EKG   Radiology No results found.  Procedures Procedures  (including critical care time)  Medications Ordered in UC Medications - No data to display  Initial Impression / Assessment and Plan / UC Course  I have reviewed the triage vital signs and the nursing notes.  Pertinent labs & imaging results that were available during my care of the patient were reviewed by me and considered in my medical decision making (see chart for details).     Patient is well-appearing.  Mild tachycardia present.  Blood pressure elevated at 155/115.  Patient is diaphoretic upon assessment.  Patient denies any dizziness or cramping at this time.  No neurodeficits noted on exam.  GCS 15.  EKG revealed sinus tachycardia without ST elevation, depression, or acute cardiac findings.  CBG was 147 in clinic.  Recommended patient be seen in the emergency department for stat blood work to rule out significant electrolyte imbalance due to symptoms and exam findings.  Patient is agreeable to plan at this time.  Patient is stable to arrived to the ER via POV. Final Clinical Impressions(s) / UC Diagnoses   Final diagnoses:  Near syncope  Tachycardia  Hypertension, unspecified type  Cramping of hands     Discharge Instructions      Go to ER for further evaluation.   ED Prescriptions   None    PDMP not reviewed this encounter.   Karon Packer, NP 07/05/23 1429    Levora Reas A, NP 07/05/23 1430

## 2023-07-05 NOTE — ED Provider Notes (Signed)
 [facility] Provider Note   CSN: [id number] Arrival date & time: 07/05/23  1418     History  Chief Complaint  Patient presents with   Near Syncope    Brenda Sweeney is a 53 y.o. female.  Patient is a 53 year old female with no significant medical history except for a few years ago having abnormal uterine bleeding with anemia which has since stopped who takes no medications and is presenting today with complaints of near syncope while at work today.  Patient reports that yesterday she had more than 5 episodes of diarrhea which she report was very watery and also had some diarrhea this morning but reports she was feeling fine when she went to work.  However after getting to work she reports she started feeling really woozy and lightheaded like she was going to pass out.  She reports this continued happening and will get a little better when she sat down but would get worse when she got up and was not getting better.  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  She started doing that but then started having cramps in her hands and feet.  She got very sweaty.  She denied any palpitations or shortness of breath during or after this event.  She did initially go to urgent care and reports she was still feeling unwell there but since she has been in the emergency room over the last 7 hours her symptoms have basically subsided.  She has not had any further episodes of diarrhea since she has been here and denies any nausea or vomiting.  She denied any chest pain, back pain.  No prior history of hypertension and reports the last time she is aware that her blood pressure was checked was last year when she went to urgent care for a sinus infection.  She does not take any over-the-counter medications and denies any drug use.  She has stopped  smoking.  She denies any urinary symptoms and denies abdominal pain.  No unilateral leg pain or swelling and denies contraceptives.  The history is provided by the patient.  Near Syncope       Home Medications Prior to Admission medications   Medication Sig Start Date End Date Taking? Authorizing Provider  levocetirizine (XYZAL ) 5 MG tablet Take 1 tablet (5 mg total) by mouth every evening. 02/03/23 08/02/23 Yes Eloise Hake Scales, PA-C  Multiple Vitamin (MULTIVITAMIN) capsule Take 1 capsule by mouth daily.   Yes [provider]      Allergies    Prozac [fluoxetine hcl] and Prednisone    Review of Systems   Review of Systems  Cardiovascular:  Positive for near-syncope.    Physical Exam Updated Vital Signs BP (!) 153/104 (BP Location: Right Arm)   Pulse 90   Temp 97.8 F (36.6 C) (Oral)   Resp 20   Ht 5\' 5"  (1.651 m)   Wt 83.5 kg   SpO2 100%   BMI 30.63 kg/m  Physical Exam Vitals and nursing note reviewed.  Constitutional:      General: She is not in acute distress.    Appearance: She is well-developed.  HENT:     Head: Normocephalic and atraumatic.     Mouth/Throat:     Mouth: Mucous membranes are moist.  Eyes:     Pupils: Pupils are equal,  round, and reactive to light.  Cardiovascular:     Rate and Rhythm: Normal rate and regular rhythm.     Heart sounds: Normal heart sounds. No murmur heard.    No friction rub.     Comments: Pulse on the right arm is fainter then pulse on the left Pulmonary:     Effort: Pulmonary effort is normal.     Breath sounds: Normal breath sounds. No wheezing or rales.  Abdominal:     General: Bowel sounds are normal. There is no distension.     Palpations: Abdomen is soft.     Tenderness: There is no abdominal tenderness. There is no guarding or rebound.  Musculoskeletal:        General: No tenderness. Normal range of motion.     Comments: No edema  Skin:    General: Skin is warm and dry.     Findings: No rash.   Neurological:     Mental Status: She is alert and oriented to person, place, and time. Mental status is at baseline.     Cranial Nerves: No cranial nerve deficit.     Sensory: No sensory deficit.     Motor: No weakness.     Gait: Gait normal.  Psychiatric:        Mood and Affect: Mood normal.        Behavior: Behavior normal.     ED Results / Procedures / Treatments   Labs (all labs ordered are listed, but only abnormal results are displayed) Labs Reviewed  CBC WITH DIFFERENTIAL/PLATELET - Abnormal; Notable for the following components:      Result Value   WBC 16.3 (*)    RBC 5.22 (*)    Hemoglobin 17.1 (*)    HCT 50.6 (*)    Neutro Abs 12.4 (*)    Monocytes Absolute 1.2 (*)    Abs Immature Granulocytes 0.10 (*)    All other components within normal limits  COMPREHENSIVE METABOLIC PANEL WITH GFR - Abnormal; Notable for the following components:   Creatinine, Ser 1.60 (*)    Total Protein 8.3 (*)    GFR, Estimated 38 (*)    All other components within normal limits  TROPONIN I (HIGH SENSITIVITY) - Abnormal; Notable for the following components:   Troponin I (High Sensitivity) 71 (*)    All other components within normal limits  TROPONIN I (HIGH SENSITIVITY) - Abnormal; Notable for the following components:   Troponin I (High Sensitivity) 79 (*)    All other components within normal limits  TROPONIN I (HIGH SENSITIVITY) - Abnormal; Notable for the following components:   Troponin I (High Sensitivity) 92 (*)    All other components within normal limits  MAGNESIUM  TSH    EKG EKG Interpretation Date/Time:  Tuesday July 05 2023 21:17:36 EDT Ventricular Rate:  66 PR Interval:  154 QRS Duration:  76 QT Interval:  438 QTC Calculation: 459 R Axis:   -3  Text Interpretation: Sinus rhythm Abnormal R-wave progression, early transition new  Abnormal T, consider ischemia, diffuse leads Confirmed by Almond Army ([id number]) on 07/05/2023 9:41:22 PM  Radiology CT Angio  Chest/Abd/Pel for Dissection W and/or Wo Contrast Result Date: 07/05/2023 CLINICAL DATA:  Near syncope.  Hypertension.  On even pulses. EXAM: CT ANGIOGRAPHY CHEST, ABDOMEN AND PELVIS TECHNIQUE: Non-contrast CT of the chest was initially obtained. Multidetector CT imaging through the chest, abdomen and pelvis was performed using the standard protocol during bolus administration of intravenous contrast. Multiplanar reconstructed images and  MIPs were obtained and reviewed to evaluate the vascular anatomy. RADIATION DOSE REDUCTION: This exam was performed according to the departmental dose-optimization program which includes automated exposure control, adjustment of the mA and/or kV according to patient size and/or use of iterative reconstruction technique. CONTRAST:  OMNIPAQUE  IOHEXOL  350 MG/ML SOLN COMPARISON:  CT abdomen and pelvis 10/06/2019. FINDINGS: CTA CHEST FINDINGS Cardiovascular: Preferential opacification of the thoracic aorta. No evidence of thoracic aortic aneurysm or dissection. Normal heart size. No pericardial effusion. Origin of the great vessels appears within normal limits. Mediastinum/Nodes: No enlarged mediastinal, hilar, or axillary lymph nodes. Thyroid gland, trachea, and esophagus demonstrate no significant findings. Lungs/Pleura: Lungs are clear. No pleural effusion or pneumothorax. Musculoskeletal: No chest wall abnormality. No acute or significant osseous findings. Review of the MIP images confirms the above findings. CTA ABDOMEN AND PELVIS FINDINGS VASCULAR Aorta: Normal caliber aorta without aneurysm, dissection, vasculitis or significant stenosis. Celiac: Patent without evidence of aneurysm, dissection, vasculitis or significant stenosis. SMA: Patent without evidence of aneurysm, dissection, vasculitis or significant stenosis. Renals: Both renal arteries are patent without evidence of aneurysm, dissection, vasculitis, fibromuscular dysplasia or significant stenosis. IMA: Patent  without evidence of aneurysm, dissection, vasculitis or significant stenosis. Inflow: Patent without evidence of aneurysm, dissection, vasculitis or significant stenosis. Veins: No obvious venous abnormality within the limitations of this arterial phase study. Review of the MIP images confirms the above findings. NON-VASCULAR Hepatobiliary: There is a 9 mm hypodense lesion in the left lobe of the liver favored as a cyst. Otherwise, the liver, gallbladder and bile ducts are within normal limits. Pancreas: Unremarkable. No pancreatic ductal dilatation or surrounding inflammatory changes. Spleen: Normal in size without focal abnormality. Adrenals/Urinary Tract: Adrenal glands are unremarkable. Kidneys are normal, without renal calculi, focal lesion, or hydronephrosis. Bladder is unremarkable. Stomach/Bowel: Stomach is within normal limits. Appendix appears normal. No evidence of bowel wall thickening, distention, or inflammatory changes. Lymphatic: No enlarged lymph nodes are seen. Reproductive: Uterus and left adnexa are within normal limits. There is a rounded cystic area in the right adnexa measuring 3.2 Cm likely related to the right ovary. Other: No abdominal wall hernia or abnormality. No abdominopelvic ascites. Musculoskeletal: No fracture is seen. Review of the MIP images confirms the above findings. IMPRESSION: 1. No evidence for aortic aneurysm or dissection. 2. No acute localizing process in the chest, abdomen or pelvis. 3. 3.2 cm cystic area in the right adnexa likely related to the right ovary. Recommend follow-up US  in 6-12 months. Note: This recommendation does not apply to premenarchal patients and to those with increased risk (genetic, family history, elevated tumor markers or other high-risk factors) of ovarian cancer. Reference: JACR 2020 Feb; 17(2):248-254 Electronically Signed   By: Tyron Gallon M.D.   On: 07/05/2023 22:56    Procedures Procedures    Medications Ordered in ED Medications   lactated ringers  bolus 500 mL (500 mLs Intravenous New Bag/Given 07/05/23 2216)  iohexol  (OMNIPAQUE ) 350 MG/ML injection 100 mL (100 mLs Intravenous Contrast Given 07/05/23 2244)    ED Course/ Medical Decision Making/ A&P                                 Medical Decision Making Amount and/or Complexity of Data Reviewed External Data Reviewed: notes. Labs: ordered. Decision-making details documented in ED Course. Radiology: ordered and independent interpretation performed. Decision-making details documented in ED Course. ECG/medicine tests: ordered and independent interpretation performed. Decision-making details documented  in ED Course.  Risk Prescription drug management. Decision regarding hospitalization.   Pt with multiple medical problems and comorbidities and presenting today with a complaint that caries a high risk for morbidity and mortality.  Here today with several complaints.  Patient's near syncope most likely related to dehydration from recurrent diarrhea over the last 24 hours however patient is also significantly hypertensive which is new for this patient.  Concern for possible atypical ACS, dysrhythmia, aortic dissection, lower suspicion for PE as patient does not have risk factors.  Also concern for electrolyte abnormality or AKI.  I independently interpreted patient's labs and EKG.  CBC today with a leukocytosis of 16 and a hemoglobin of 17 with normal platelet count.  CMP with new AKI with creatinine of 1.6 from baseline of less then 1 in 2021 without more recent to compare with normal electrolytes and calcium  levels.  LFTs are normal.  Magnesium is normal.  Patient's troponins today are 71 and 79.  There are no old to compare and this could be related to cardiac strain from more long-term hypertension that was unknown versus dissection or ACS.  Patient still denies any chest pain at this time.  Blood pressure remains elevated.  EKG at urgent care showed a sinus tachycardia but  was otherwise normal.  Repeat EKG here shows significant T wave inversion inferior laterally which is new. And trop continues to gradually rise.  CTA is neg for acute findings other than ovarian cyst that needs outpt management.  Will admit due to rising trop and EKG changes.  Will admit to hospitalist.         Final Clinical Impression(s) / ED Diagnoses Final diagnoses:  Near syncope  AKI (acute kidney injury) (HCC)  Elevated troponin  Uncontrolled hypertension    Rx / DC Orders ED Discharge Orders     None         Almond Army, MD 07/05/23 2327

## 2023-07-05 NOTE — ED Notes (Signed)
 Patient is being discharged from the Urgent Care and sent to the Emergency Department via POV. Per Levora Reas, NP, patient is in need of higher level of care due to near syncope. Patient is aware and verbalizes understanding of plan of care.  Vitals:   07/05/23 1323  BP: (!) 155/115  Pulse: 100  Resp: 17  Temp: 98.1 F (36.7 C)  SpO2: 100%

## 2023-07-05 NOTE — ED Triage Notes (Signed)
 When got to work [phone] felt like going to pass out. They called the school nurse, BP and O2 was good. Since had not ate much got her something else to eat and drink with electrolytes. Pt reports both hands were cramping up that will shoot up both fore arms. The cramping in hands has been intermittent, tried mustard under tongue that didn't help.

## 2023-07-05 NOTE — ED Notes (Signed)
 Called and placed PT on monitor with CCMD.

## 2023-07-05 NOTE — ED Provider Triage Note (Signed)
 Emergency Medicine Provider Triage Evaluation Note  Brenda Sweeney , a 53 y.o. female  was evaluated in triage.  Pt complains of an episode of feeling very hot and sweaty earlier today at work.  She felt like she was going to pass out, but did not pass out.  Denies any chest pain or shortness of breath.  Also reports some cramping in her hands.  Review of Systems  Positive: As above Negative: As above  Physical Exam  BP (!) 186/96 (BP Location: Left Arm)   Pulse 100   Temp 98.4 F (36.9 C)   Resp 20   SpO2 100%  Gen:   Awake, no distress   Resp:  Normal effort  MSK:   Moves extremities without difficulty  Other:  No focal neuro deficits  Medical Decision Making  Medically screening exam initiated at 3:37 PM.  Appropriate orders placed.  Brenda Sweeney was informed that the remainder of the evaluation will be completed by another provider, this initial triage assessment does not replace that evaluation, and the importance of remaining in the ED until their evaluation is complete.     Rexie Catena, PA-C 07/05/23 1538

## 2023-07-05 NOTE — Discharge Instructions (Signed)
 Go to ER for further evaluation .

## 2023-07-06 ENCOUNTER — Observation Stay (HOSPITAL_COMMUNITY)

## 2023-07-06 ENCOUNTER — Other Ambulatory Visit: Payer: Self-pay | Admitting: Home Health

## 2023-07-06 DIAGNOSIS — E785 Hyperlipidemia, unspecified: Secondary | ICD-10-CM | POA: Diagnosis not present

## 2023-07-06 DIAGNOSIS — R9431 Abnormal electrocardiogram [ECG] [EKG]: Secondary | ICD-10-CM

## 2023-07-06 DIAGNOSIS — R252 Cramp and spasm: Secondary | ICD-10-CM

## 2023-07-06 DIAGNOSIS — N179 Acute kidney failure, unspecified: Principal | ICD-10-CM

## 2023-07-06 DIAGNOSIS — E860 Dehydration: Secondary | ICD-10-CM | POA: Diagnosis not present

## 2023-07-06 DIAGNOSIS — R000 Tachycardia, unspecified: Secondary | ICD-10-CM | POA: Diagnosis not present

## 2023-07-06 DIAGNOSIS — R7303 Prediabetes: Secondary | ICD-10-CM | POA: Diagnosis not present

## 2023-07-06 DIAGNOSIS — E66811 Obesity, class 1: Secondary | ICD-10-CM | POA: Diagnosis not present

## 2023-07-06 DIAGNOSIS — R55 Syncope and collapse: Secondary | ICD-10-CM | POA: Diagnosis not present

## 2023-07-06 DIAGNOSIS — I499 Cardiac arrhythmia, unspecified: Secondary | ICD-10-CM

## 2023-07-06 DIAGNOSIS — I2510 Atherosclerotic heart disease of native coronary artery without angina pectoris: Secondary | ICD-10-CM | POA: Diagnosis not present

## 2023-07-06 DIAGNOSIS — R778 Other specified abnormalities of plasma proteins: Secondary | ICD-10-CM | POA: Diagnosis not present

## 2023-07-06 DIAGNOSIS — R7989 Other specified abnormal findings of blood chemistry: Secondary | ICD-10-CM

## 2023-07-06 DIAGNOSIS — Z8249 Family history of ischemic heart disease and other diseases of the circulatory system: Secondary | ICD-10-CM | POA: Diagnosis not present

## 2023-07-06 DIAGNOSIS — R079 Chest pain, unspecified: Secondary | ICD-10-CM

## 2023-07-06 DIAGNOSIS — D751 Secondary polycythemia: Secondary | ICD-10-CM | POA: Diagnosis not present

## 2023-07-06 DIAGNOSIS — Z6830 Body mass index (BMI) 30.0-30.9, adult: Secondary | ICD-10-CM | POA: Diagnosis not present

## 2023-07-06 DIAGNOSIS — K529 Noninfective gastroenteritis and colitis, unspecified: Secondary | ICD-10-CM | POA: Diagnosis not present

## 2023-07-06 DIAGNOSIS — I160 Hypertensive urgency: Secondary | ICD-10-CM | POA: Diagnosis not present

## 2023-07-06 DIAGNOSIS — I2489 Other forms of acute ischemic heart disease: Secondary | ICD-10-CM | POA: Diagnosis not present

## 2023-07-06 DIAGNOSIS — I159 Secondary hypertension, unspecified: Secondary | ICD-10-CM | POA: Diagnosis not present

## 2023-07-06 DIAGNOSIS — R42 Dizziness and giddiness: Secondary | ICD-10-CM

## 2023-07-06 DIAGNOSIS — Z83438 Family history of other disorder of lipoprotein metabolism and other lipidemia: Secondary | ICD-10-CM | POA: Diagnosis not present

## 2023-07-06 DIAGNOSIS — Z888 Allergy status to other drugs, medicaments and biological substances status: Secondary | ICD-10-CM | POA: Diagnosis not present

## 2023-07-06 DIAGNOSIS — I10 Essential (primary) hypertension: Secondary | ICD-10-CM

## 2023-07-06 DIAGNOSIS — N83209 Unspecified ovarian cyst, unspecified side: Secondary | ICD-10-CM

## 2023-07-06 DIAGNOSIS — N83201 Unspecified ovarian cyst, right side: Secondary | ICD-10-CM | POA: Diagnosis not present

## 2023-07-06 DIAGNOSIS — F17210 Nicotine dependence, cigarettes, uncomplicated: Secondary | ICD-10-CM | POA: Diagnosis not present

## 2023-07-06 DIAGNOSIS — Z833 Family history of diabetes mellitus: Secondary | ICD-10-CM | POA: Diagnosis not present

## 2023-07-06 LAB — BASIC METABOLIC PANEL WITH GFR
Anion gap: 15 (ref 5–15)
BUN: 10 mg/dL (ref 6–20)
CO2: 23 mmol/L (ref 22–32)
Calcium: 10.1 mg/dL (ref 8.9–10.3)
Chloride: 103 mmol/L (ref 98–111)
Creatinine, Ser: 1.03 mg/dL — ABNORMAL HIGH (ref 0.44–1.00)
GFR, Estimated: >60 mL/min (ref >60)
Glucose, Bld: 109 mg/dL — ABNORMAL HIGH (ref 70–99)
Potassium: 3.6 mmol/L (ref 3.5–5.1)
Sodium: 141 mmol/L (ref 135–145)

## 2023-07-06 LAB — TROPONIN I (HIGH SENSITIVITY): Troponin I (High Sensitivity): 52 ng/L — ABNORMAL HIGH (ref <18)

## 2023-07-06 LAB — APTT: aPTT: 29 s (ref 24–36)

## 2023-07-06 LAB — ECHOCARDIOGRAM COMPLETE
AR max vel: 2.44 cm2
AV Area VTI: 2.77 cm2
AV Area mean vel: 2.3 cm2
AV Mean grad: 6 mmHg
AV Peak grad: 10.8 mmHg
Ao pk vel: 1.64 m/s
Area-P 1/2: 3.08 cm2
Height: 65 in
S' Lateral: 2.8 cm
Weight: 2945.35 [oz_av]

## 2023-07-06 LAB — LIPID PANEL
Cholesterol: 168 mg/dL (ref 0–200)
HDL: 34 mg/dL — ABNORMAL LOW (ref >40)
LDL Cholesterol: 115 mg/dL — ABNORMAL HIGH (ref 0–99)
Total CHOL/HDL Ratio: 4.9 ratio
Triglycerides: 93 mg/dL (ref <150)
VLDL: 19 mg/dL (ref 0–40)

## 2023-07-06 LAB — CBC
HCT: 43 % (ref 36.0–46.0)
Hemoglobin: 15.1 g/dL — ABNORMAL HIGH (ref 12.0–15.0)
MCH: 33.1 pg (ref 26.0–34.0)
MCHC: 35.1 g/dL (ref 30.0–36.0)
MCV: 94.3 fL (ref 80.0–100.0)
Platelets: 260 10*3/uL (ref 150–400)
RBC: 4.56 MIL/uL (ref 3.87–5.11)
RDW: 13.3 % (ref 11.5–15.5)
WBC: 10.7 10*3/uL — ABNORMAL HIGH (ref 4.0–10.5)
nRBC: 0 % (ref 0.0–0.2)

## 2023-07-06 LAB — HIV ANTIBODY (ROUTINE TESTING W REFLEX): HIV Screen 4th Generation wRfx: NONREACTIVE

## 2023-07-06 LAB — HEMOGLOBIN A1C
Hgb A1c MFr Bld: 5.6 % (ref 4.8–5.6)
Mean Plasma Glucose: 114.02 mg/dL

## 2023-07-06 LAB — PROTIME-INR
INR: 1 (ref 0.8–1.2)
Prothrombin Time: 13.2 s (ref 11.4–15.2)

## 2023-07-06 MED ORDER — ACETAMINOPHEN 650 MG RE SUPP: 650.0000 mg | Freq: Four times a day (QID) | RECTAL | Status: DC | PRN

## 2023-07-06 MED ORDER — POLYETHYLENE GLYCOL 3350 17 G PO PACK: 17.0000 g | PACK | Freq: Every day | ORAL | Status: DC | PRN

## 2023-07-06 MED ORDER — LABETALOL HCL 5 MG/ML IV SOLN: 20.0000 mg | INTRAVENOUS | Status: DC | PRN

## 2023-07-06 MED ORDER — LABETALOL HCL 5 MG/ML IV SOLN: 10.0000 mg | INTRAVENOUS | Status: DC | PRN

## 2023-07-06 MED ORDER — ADULT MULTIVITAMIN W/MINERALS CH
1.0000 | ORAL_TABLET | Freq: Every day | ORAL | Status: DC
  Administered 2023-07-06 – 2023-07-07 (×2): 1 via ORAL
  Filled 2023-07-06 (×2): qty 1

## 2023-07-06 MED ORDER — ACETAMINOPHEN 325 MG PO TABS: 650.0000 mg | ORAL_TABLET | Freq: Four times a day (QID) | ORAL | Status: DC | PRN

## 2023-07-06 MED ORDER — AMLODIPINE BESYLATE 5 MG PO TABS
5.0000 mg | ORAL_TABLET | Freq: Every day | ORAL | Status: DC
  Administered 2023-07-06 – 2023-07-07 (×2): 5 mg via ORAL
  Filled 2023-07-06 (×2): qty 1

## 2023-07-06 MED ORDER — LACTATED RINGERS IV BOLUS
1000.0000 mL | Freq: Once | INTRAVENOUS | Status: AC
  Administered 2023-07-06: 1000 mL via INTRAVENOUS

## 2023-07-06 MED ORDER — ROSUVASTATIN CALCIUM 5 MG PO TABS
5.0000 mg | ORAL_TABLET | Freq: Every day | ORAL | Status: DC
  Administered 2023-07-06: 5 mg via ORAL
  Filled 2023-07-06: qty 1

## 2023-07-06 MED ORDER — NITROGLYCERIN 0.4 MG SL SUBL
SUBLINGUAL_TABLET | SUBLINGUAL | Status: AC
Start: 2023-07-06 — End: ?
  Filled 2023-07-06: qty 2

## 2023-07-06 MED ORDER — HYDRALAZINE HCL 25 MG PO TABS: 25.0000 mg | ORAL_TABLET | ORAL | Status: DC | PRN

## 2023-07-06 MED ORDER — SODIUM CHLORIDE 0.9% FLUSH
3.0000 mL | Freq: Two times a day (BID) | INTRAVENOUS | Status: DC
  Administered 2023-07-06 (×2): 3 mL via INTRAVENOUS

## 2023-07-06 MED ORDER — ENOXAPARIN SODIUM 40 MG/0.4ML IJ SOSY
40.0000 mg | PREFILLED_SYRINGE | INTRAMUSCULAR | Status: DC
  Administered 2023-07-07: 40 mg via SUBCUTANEOUS
  Filled 2023-07-06: qty 0.4

## 2023-07-06 MED ORDER — DEXTROSE IN LACTATED RINGERS 5 % IV SOLN
INTRAVENOUS | Status: DC
Start: 2023-07-06 — End: 2023-07-07

## 2023-07-06 MED ORDER — IOHEXOL 350 MG/ML SOLN
100.0000 mL | Freq: Once | INTRAVENOUS | Status: AC | PRN
  Administered 2023-07-06: 100 mL via INTRAVENOUS

## 2023-07-06 NOTE — Hospital Course (Signed)
 Brenda Sweeney is a 53 yo female with PMH HTN, pre-diabetes who presented with an episode of diarrhea and presyncope.  She was found to be hypertensive on workup with indeterminant elevated troponins.  EKG showed T wave inversions.  She was admitted for cardiology evaluation and further workup.

## 2023-07-06 NOTE — Assessment & Plan Note (Addendum)
 Per CT: 3.2 cm cystic area in the right adnexa likely related to the right ovary. Recommend follow-up US  in 6-12 months

## 2023-07-06 NOTE — Progress Notes (Signed)
 Progress Note  Patient Name: Brenda Sweeney Date of Encounter: 07/06/2023  Primary Cardiologist: None   Subjective   Patient seen and examined - she was in bed when I arrived.   Inpatient Medications    Scheduled Meds:  [START ON 07/07/2023] enoxaparin  (LOVENOX ) injection  40 mg Subcutaneous Q24H   multivitamin with minerals  1 tablet Oral Daily   sodium chloride  flush  3 mL Intravenous Q12H   Continuous Infusions:  dextrose  5% lactated ringers      PRN Meds: acetaminophen  **OR** acetaminophen , hydrALAZINE , labetalol , polyethylene glycol   Vital Signs    Vitals:   07/06/23 0015 07/06/23 0030 07/06/23 0229 07/06/23 0323  BP: (!) 140/84 (!) 159/84  (!) 155/81  Pulse: 61 78  69  Resp: (!) 24 16  18   Temp:   (!) 97.5 F (36.4 C) 98.5 F (36.9 C)  TempSrc:    Oral  SpO2: 98% 100%  100%  Weight:      Height:        Intake/Output Summary (Last 24 hours) at 07/06/2023 0926 Last data filed at 07/05/2023 2350 Gross per 24 hour  Intake 500 ml  Output --  Net 500 ml   Filed Weights   07/05/23 2207  Weight: 83.5 kg    Telemetry    Sinus rhythm - Personally Reviewed  ECG    Sinus rhythm with HR 66 bpm and t wave inversion in the inferior lateral walls - Personally Reviewed  Physical Exam   General: Comfortable Head: Atraumatic, normal size  Eyes: PEERLA, EOMI  Neck: Supple, normal JVD Cardiac: Normal S1, S2; RRR; no murmurs, rubs, or gallops Lungs: Clear to auscultation bilaterally Abd: Soft, nontender, no hepatomegaly  Ext: warm, no edema Musculoskeletal: No deformities, BUE and BLE strength normal and equal Skin: Warm and dry, no rashes   Neuro: Alert and oriented to person, place, time, and situation, CNII-XII grossly intact, no focal deficits  Psych: Normal mood and affect   Labs    Chemistry Recent Labs  Lab 07/05/23 1531 07/06/23 0642  NA 135 141  K 4.0 3.6  CL 100 103  CO2 22 23  GLUCOSE 97 109*  BUN 17 10  CREATININE  1.60* 1.03*  CALCIUM  10.0 10.1  PROT 8.3*  --   ALBUMIN 4.5  --   AST 23  --   ALT 19  --   ALKPHOS 84  --   BILITOT 0.5  --   GFRNONAA 38* >60  ANIONGAP 13 15     Hematology Recent Labs  Lab 07/05/23 1531 07/06/23 0642  WBC 16.3* 10.7*  RBC 5.22* 4.56  HGB 17.1* 15.1*  HCT 50.6* 43.0  MCV 96.9 94.3  MCH 32.8 33.1  MCHC 33.8 35.1  RDW 13.5 13.3  PLT 327 260    Cardiac EnzymesNo results for input(s): "TROPONINI" in the last 168 hours. No results for input(s): "TROPIPOC" in the last 168 hours.   BNPNo results for input(s): "BNP", "PROBNP" in the last 168 hours.   DDimer No results for input(s): "DDIMER" in the last 168 hours.   Radiology    CT Angio Chest/Abd/Pel for Dissection W and/or Wo Contrast Result Date: 07/05/2023 CLINICAL DATA:  Near syncope.  Hypertension.  On even pulses. EXAM: CT ANGIOGRAPHY CHEST, ABDOMEN AND PELVIS TECHNIQUE: Non-contrast CT of the chest was initially obtained. Multidetector CT imaging through the chest, abdomen and pelvis was performed using the standard protocol during bolus administration of intravenous contrast. Multiplanar reconstructed images and MIPs  were obtained and reviewed to evaluate the vascular anatomy. RADIATION DOSE REDUCTION: This exam was performed according to the departmental dose-optimization program which includes automated exposure control, adjustment of the mA and/or kV according to patient size and/or use of iterative reconstruction technique. CONTRAST:  OMNIPAQUE  IOHEXOL  350 MG/ML SOLN COMPARISON:  CT abdomen and pelvis 10/06/2019. FINDINGS: CTA CHEST FINDINGS Cardiovascular: Preferential opacification of the thoracic aorta. No evidence of thoracic aortic aneurysm or dissection. Normal heart size. No pericardial effusion. Origin of the great vessels appears within normal limits. Mediastinum/Nodes: No enlarged mediastinal, hilar, or axillary lymph nodes. Thyroid gland, trachea, and esophagus demonstrate no significant  findings. Lungs/Pleura: Lungs are clear. No pleural effusion or pneumothorax. Musculoskeletal: No chest wall abnormality. No acute or significant osseous findings. Review of the MIP images confirms the above findings. CTA ABDOMEN AND PELVIS FINDINGS VASCULAR Aorta: Normal caliber aorta without aneurysm, dissection, vasculitis or significant stenosis. Celiac: Patent without evidence of aneurysm, dissection, vasculitis or significant stenosis. SMA: Patent without evidence of aneurysm, dissection, vasculitis or significant stenosis. Renals: Both renal arteries are patent without evidence of aneurysm, dissection, vasculitis, fibromuscular dysplasia or significant stenosis. IMA: Patent without evidence of aneurysm, dissection, vasculitis or significant stenosis. Inflow: Patent without evidence of aneurysm, dissection, vasculitis or significant stenosis. Veins: No obvious venous abnormality within the limitations of this arterial phase study. Review of the MIP images confirms the above findings. NON-VASCULAR Hepatobiliary: There is a 9 mm hypodense lesion in the left lobe of the liver favored as a cyst. Otherwise, the liver, gallbladder and bile ducts are within normal limits. Pancreas: Unremarkable. No pancreatic ductal dilatation or surrounding inflammatory changes. Spleen: Normal in size without focal abnormality. Adrenals/Urinary Tract: Adrenal glands are unremarkable. Kidneys are normal, without renal calculi, focal lesion, or hydronephrosis. Bladder is unremarkable. Stomach/Bowel: Stomach is within normal limits. Appendix appears normal. No evidence of bowel wall thickening, distention, or inflammatory changes. Lymphatic: No enlarged lymph nodes are seen. Reproductive: Uterus and left adnexa are within normal limits. There is a rounded cystic area in the right adnexa measuring 3.2 Cm likely related to the right ovary. Other: No abdominal wall hernia or abnormality. No abdominopelvic ascites. Musculoskeletal: No  fracture is seen. Review of the MIP images confirms the above findings. IMPRESSION: 1. No evidence for aortic aneurysm or dissection. 2. No acute localizing process in the chest, abdomen or pelvis. 3. 3.2 cm cystic area in the right adnexa likely related to the right ovary. Recommend follow-up US  in 6-12 months. Note: This recommendation does not apply to premenarchal patients and to those with increased risk (genetic, family history, elevated tumor markers or other high-risk factors) of ovarian cancer. Reference: JACR 2020 Feb; 17(2):248-254 Electronically Signed   By: Tyron Gallon M.D.   On: 07/05/2023 22:56    Cardiac Studies     Patient Profile     53 y.o. female with hx of prediabetes, Tobacco use , Dyslipidemia  presents with presyncope  Assessment & Plan    Presyncope Abnormal ECG  Elevated troponin  AKI  Hypertension  Dyslipidemia  Tobacco use - quit 1 year ago  Her symptoms is atypical but concerning given her abnormal EKG as well as her risk factors with hypertension, dyslipidemia and previous smoker it would be beneficial to proceed with a coronary CTA in this patient Discussed with the patient she is agreeable for this.  Echocardiogram was done morning my quick review and impression normal EF, mild left ventricular hypertrophy and small RV anterior pericardial fusion.  Her  blood pressure is elevated and with the LVH going back to 2023 blood pressure in urgent care was 140/83 mmHg she would benefit from starting antihypertensive medication.  I will start amlodipine  5 mg daily on this patient.  Noted hyperlipidemia in 2021 LDL was 124, she would benefit from low intensity but will wait to see if there is any significant burden of coronary artery disease on coronary CTA and then likely moderate intensity or high intensity would be appropriate at that time.  Follow-up LP(a) as well.  Prediabetic with lab 5.8 in 2021 repeating her lipid profile.  AKI-this has improved  creatinine 1.03, post coronary CTA will encourage hydration and repeat testing.   For questions or updates, please contact [facility] Please consult [email or web address] for contact info under Cardiology/STEMI.      Signed, Wells Mabe, DO  07/06/2023, 9:26 AM

## 2023-07-06 NOTE — Assessment & Plan Note (Addendum)
 Curnlty resolved. C.w. telemtry echo . Likely due to dehydration and possibly from GI losses

## 2023-07-06 NOTE — Progress Notes (Signed)
 Chaplain responded to a request from the consult list to provide information about the AD form. Pt received the information and requested some time to fill the form. Chaplain asked Pt to notify this office when the form is ready for notarization.  Althea Jesus Chaplain Resident   07/06/23 1116  Spiritual Encounters  Type of Visit Initial  Care provided to: Patient  Referral source Clinical staff  Reason for visit Advance directives  OnCall Visit No

## 2023-07-06 NOTE — Assessment & Plan Note (Signed)
 Patient had cramps of hands prior to presentaton . Calcium  wnl. Magnesium wnl. K wnl. No recurrence. Monitor clinically.

## 2023-07-06 NOTE — Progress Notes (Signed)
 Progress Note    Brenda Sweeney   ZOX:[id number]  DOB: 1970-06-16  DOA: 07/05/2023     0 PCP: Patient, No Pcp Per  Initial CC: CP  Hospital Course: Brenda Sweeney is a 53 yo female with PMH HTN, pre-diabetes who presented with an episode of diarrhea and presyncope.  She was found to be hypertensive on workup with indeterminant elevated troponins.  EKG showed T wave inversions.  She was admitted for cardiology evaluation and further workup.  Interval History:  Feeling better when seen this morning. Improved/resolved CP. Denied SOB.   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 - possibly BP mediated but patient also recommended for Zio at discharge per cardiology  HTN (hypertension) - Has been elevated in the past and found to be as high as 186/96 on admission with persistently high diastolic pressures - Started on amlodipine  for now per cardiology  AKI (acute kidney injury) (HCC) - baseline creatinine ~ 1 - patient presents with increase in creat >0.3 mg/dL above baseline or creat increase >1.5x baseline presumed to have occurred within past 7 days PTA - creat 1.6 on admission - presumed pre-renal; improved with IVF; some diarrhea PTA - now back to baseline; continue diet    Ovarian cyst Per CT: 3.2 cm cystic area in the right adnexa likely related to the right ovary. Recommend follow-up US  in 6-12 months  Prediabetes - Repeat A1c 5.6% on admission - Continue diet control  HLD (hyperlipidemia) - LDL 115  Cramps, extremity-resolved as of 07/06/2023 Patient had cramps of hands prior to presentaton . Calcium  wnl. Magnesium wnl. K wnl. No recurrence. Monitor clinically.  Enteritis-resolved as of 07/06/2023 - Seems to have resolved quickly on admission - If any further diarrhea,  will test  Postural dizziness with presyncope-resolved as of 07/06/2023 Curnlty resolved. C.w. telemtry echo . Likely due to dehydration and possibly from GI losses   Old records reviewed in assessment of this patient  Antimicrobials:   DVT prophylaxis:  enoxaparin  (LOVENOX ) injection 40 mg Start: 07/07/23 1000 SCDs Start: 07/06/23 0030   Code Status:   Code Status: Full Code  Mobility Assessment (Last 72 Hours)     Mobility Assessment   No documentation.           Barriers to discharge: none Disposition Plan:  Home  HH orders placed: n/a Status is: Inpt  Objective: Blood pressure (!) 155/81, pulse 69, temperature 98.5 F (36.9 C), temperature source Oral, resp. rate 18, height 5\' 5"  (1.651 m), weight 83.5 kg, SpO2 100%.  Examination:  Physical Exam Constitutional:      Appearance: Normal appearance.  HENT:     Head: Normocephalic and atraumatic.     Mouth/Throat:     Mouth: Mucous membranes are moist.  Eyes:     Extraocular Movements: Extraocular movements intact.  Cardiovascular:     Rate and Rhythm: Normal rate and regular rhythm.  Pulmonary:     Effort: Pulmonary effort is normal. No respiratory distress.     Breath sounds: Normal breath sounds. No wheezing.  Abdominal:     General: Bowel sounds are normal. There is no distension.     Palpations: Abdomen is soft.     Tenderness: There is no abdominal tenderness.  Musculoskeletal:        General: Normal range of motion.  Cervical back: Normal range of motion and neck supple.  Skin:    General: Skin is warm and dry.  Neurological:     General: No focal deficit present.     Mental Status: She is alert.  Psychiatric:        Mood and Affect: Mood normal.      Consultants:  Cardiology   Procedures:    Data Reviewed: Results for orders placed or performed during the hospital encounter of 07/05/23 (from the past 24 hours)  Troponin I (High Sensitivity)     Status: Abnormal   Collection Time:  07/05/23  6:22 PM  Result Value Ref Range   Troponin I (High Sensitivity) 79 (H) <18 ng/L  TSH     Status: None   Collection Time: 07/05/23 10:00 PM  Result Value Ref Range   TSH 2.317 0.350 - 4.500 uIU/mL  Troponin I (High Sensitivity)     Status: Abnormal   Collection Time: 07/05/23 10:00 PM  Result Value Ref Range   Troponin I (High Sensitivity) 92 (H) <18 ng/L  HIV Antibody (routine testing w rflx)     Status: None   Collection Time: 07/06/23  6:42 AM  Result Value Ref Range   HIV Screen 4th Generation wRfx Non Reactive Non Reactive  APTT     Status: None   Collection Time: 07/06/23  6:42 AM  Result Value Ref Range   aPTT 29 24 - 36 seconds  Protime-INR     Status: None   Collection Time: 07/06/23  6:42 AM  Result Value Ref Range   Prothrombin Time 13.2 11.4 - 15.2 seconds   INR 1.0 0.8 - 1.2  Basic metabolic panel     Status: Abnormal   Collection Time: 07/06/23  6:42 AM  Result Value Ref Range   Sodium 141 135 - 145 mmol/L   Potassium 3.6 3.5 - 5.1 mmol/L   Chloride 103 98 - 111 mmol/L   CO2 23 22 - 32 mmol/L   Glucose, Bld 109 (H) 70 - 99 mg/dL   BUN 10 6 - 20 mg/dL   Creatinine, Ser 4.09 (H) 0.44 - 1.00 mg/dL   Calcium  10.1 8.9 - 10.3 mg/dL   GFR, Estimated >81 >19 mL/min   Anion gap 15 5 - 15  CBC     Status: Abnormal   Collection Time: 07/06/23  6:42 AM  Result Value Ref Range   WBC 10.7 (H) 4.0 - 10.5 K/uL   RBC 4.56 3.87 - 5.11 MIL/uL   Hemoglobin 15.1 (H) 12.0 - 15.0 g/dL   HCT 14.7 82.9 - 56.2 %   MCV 94.3 80.0 - 100.0 fL   MCH 33.1 26.0 - 34.0 pg   MCHC 35.1 30.0 - 36.0 g/dL   RDW 13.0 86.5 - 78.4 %   Platelets 260 150 - 400 K/uL   nRBC 0.0 0.0 - 0.2 %  Troponin I (High Sensitivity)     Status: Abnormal   Collection Time: 07/06/23  6:42 AM  Result Value Ref Range   Troponin I (High Sensitivity) 52 (H) <18 ng/L  Lipid panel     Status: Abnormal   Collection Time: 07/06/23  9:30 AM  Result Value Ref Range   Cholesterol 168 0 - 200 mg/dL    Triglycerides 93 <696 mg/dL   HDL 34 (L) >29 mg/dL   Total CHOL/HDL Ratio 4.9 RATIO   VLDL 19 0 - 40 mg/dL   LDL Cholesterol 528 (H) 0 - 99 mg/dL  Hemoglobin  A1c     Status: None   Collection Time: 07/06/23 10:00 AM  Result Value Ref Range   Hgb A1c MFr Bld 5.6 4.8 - 5.6 %   Mean Plasma Glucose 114.02 mg/dL    I have reviewed pertinent nursing notes, vitals, labs, and images as necessary. I have ordered labwork to follow up on as indicated.  I have reviewed the last notes from staff over past 24 hours. I have discussed patient's care plan and test results with nursing staff, CM/SW, and other staff as appropriate.  Time spent: Greater than 50% of the 55 minute visit was spent in counseling/coordination of care for the patient as laid out in the A&P.   LOS: 0 days   Faith Homes, MD [facility] 07/06/2023, 4:33 PM

## 2023-07-06 NOTE — Assessment & Plan Note (Addendum)
-   baseline creatinine ~ 1 - patient presents with increase in creat >0.3 mg/dL above baseline or creat increase >1.5x baseline presumed to have occurred within past 7 days PTA - creat 1.6 on admission - presumed pre-renal; improved with IVF; some diarrhea PTA - now back to baseline; continue diet

## 2023-07-06 NOTE — Assessment & Plan Note (Addendum)
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

## 2023-07-06 NOTE — ED Notes (Signed)
 Called 2w to inform them pt was coming up. Was transferred to nurse phone no answer

## 2023-07-06 NOTE — Plan of Care (Signed)

## 2023-07-06 NOTE — Assessment & Plan Note (Signed)
-   Has been elevated in the past and found to be as high as 186/96 on admission with persistently high diastolic pressures - Started on amlodipine  for now per cardiology

## 2023-07-06 NOTE — Progress Notes (Signed)
 2 week Live zio ordered per Dr Emmette Harms request, monitor will be applied tomorrow before discharge, no staff available today to apply.

## 2023-07-06 NOTE — Assessment & Plan Note (Signed)
-   Repeat A1c 5.6% on admission - Continue diet control

## 2023-07-06 NOTE — Consult Note (Addendum)
 Cardiology Consultation   Patient ID: ALEXIZ COTHRAN MRN: [id number]; DOB: 07-27-1970  Admit date: 07/05/2023 Date of Consult: 07/06/2023  PCP:  Patient, No Pcp Per   [facility] Providers Cardiologist:  None        Patient Profile:   Brenda Sweeney is a 53 y.o. female with no past medical history who is being seen 07/06/2023 for the evaluation of presyncope and EKG changes at the request of Dr Cleda Curly.  History of Present Illness:   Brenda Sweeney is a 53 y.o. female with no past medical history who is being seen 07/06/2023 for the evaluation of presyncope and EKG changes   Patient presented to work yesterday started feeling dizziness lightheadedness numbness in the arms never felt this before and called supervisor and nurse at [employer] who checked her blood pressure, heart rate and sent her over to the ER.    Patient reports she has been having diarrhea all day Monday and stomach cramps but this is resolved on Tuesday morning.  Denies any sick contacts for poisoning. Denies any chest pain, exertional chest pain or discomfort or dyspnea on exertion. She does not have any prior medical problems.  Former smoker quit smoking, no alcohol or drug use. No family history of premature CAD or SCD  Initial EKG shows sinus tachycardia nonspecific ST-T wave changes. Subsequent most recent EKG from 42/22/25 at 2117 shows diffuse T wave inversions in inferior and anterior lateral leads Troponin 71/79/92. Patient denies any chest pain. CTA chest does not show any coronary calcifications, no pericardial effusion, no evidence of aortic aneurysm or dissection   History reviewed. No pertinent past medical history.  Past Surgical History:  Procedure Laterality Date   DILATATION & CURETTAGE/HYSTEROSCOPY WITH MYOSURE N/A 12/19/2019   Procedure: DILATATION & CURETTAGE, HYSTEROSCOPY, MYOMECTOMY;  Surgeon: Julianne Octave, MD;   Location: [facility];  Service: Gynecology;  Laterality: N/A;   TUBAL LIGATION         Inpatient Medications: Scheduled Meds:  [START ON 07/07/2023] enoxaparin  (LOVENOX ) injection  40 mg Subcutaneous Q24H   multivitamin with minerals  1 tablet Oral Daily   sodium chloride  flush  3 mL Intravenous Q12H   Continuous Infusions:  dextrose  5% lactated ringers      lactated ringers      PRN Meds: acetaminophen  **OR** acetaminophen , labetalol , polyethylene glycol  Allergies:    Allergies  Allergen Reactions   Prozac [Fluoxetine Hcl] Other (See Comments)    Made my face lock up   Prednisone Other (See Comments)    Social History:   Social History   Socioeconomic History   Marital status: Single    Spouse name: Not on file   Number of children: Not on file   Years of education: Not on file   Highest education level: Some college, no degree  Occupational History   Not on file  Tobacco Use   Smoking status: Every Day    Current packs/day: 0.25    Average packs/day: 0.3 packs/day for 30.0 years (7.5 ttl pk-yrs)    Types: Cigarettes   Smokeless tobacco: Never  Vaping Use   Vaping status: Never Used  Substance and Sexual Activity   Alcohol use: No   Drug use: No   Sexual activity: Not Currently    Birth control/protection: Surgical  Other Topics Concern   Not on file  Social History Narrative   Not on file   Social Drivers of Health   Financial Resource Strain:  Not on file  Food Insecurity: No Food Insecurity (01/07/2020)   Hunger Vital Sign    Worried About Running Out of Food in the Last Year: Never true    Ran Out of Food in the Last Year: Never true  Transportation Needs: No Transportation Needs (01/21/2020)   PRAPARE - Administrator, Civil Service (Medical): No    Lack of Transportation (Non-Medical): No  Physical Activity: Not on file  Stress: Not on file  Social Connections: Not on file  Intimate Partner Violence: Not on file     Family History:    Family History  Problem Relation Age of Onset   Hypertension Mother    Diabetes Mother    Diabetes Father    Hypertension Father    High Cholesterol Father    Diabetes Brother    Hypertension Brother    Diabetes Paternal Grandmother    Hypertension Paternal Grandmother      ROS:  Please see the history of present illness.   All other ROS reviewed and negative.     Physical Exam/Data:   Vitals:   07/05/23 2215 07/05/23 2217 07/05/23 2330 07/05/23 2345  BP: (!) 158/130  (!) 162/94 (!) 140/74  Pulse: 71  68 69  Resp: 15  20 18   Temp:  97.8 F (36.6 C)    TempSrc:  Oral    SpO2: 100%  100% 100%  Weight:      Height:        Intake/Output Summary (Last 24 hours) at 07/06/2023 0222 Last data filed at 07/05/2023 2350 Gross per 24 hour  Intake 500 ml  Output --  Net 500 ml      07/05/2023   10:07 PM 02/05/2020    8:58 AM 01/21/2020   11:20 AM  Last 3 Weights  Weight (lbs) 184 lb 1.4 oz 184 lb 1.6 oz 183 lb 14.4 oz  Weight (kg) 83.5 kg 83.507 kg 83.416 kg     Body mass index is 30.63 kg/m.  General:  Well nourished, well developed, in no acute distress HEENT: normal Neck: no JVD Vascular: No carotid bruits; Distal pulses 2+ bilaterally Cardiac:  normal S1, S2; RRR; no murmur  Lungs:  clear to auscultation bilaterally, no wheezing, rhonchi or rales  Abd: soft, nontender, no hepatomegaly  Ext: no edema Musculoskeletal:  No deformities, BUE and BLE strength normal and equal Skin: warm and dry  Neuro:  CNs 2-12 intact, no focal abnormalities noted Psych:  Normal affect     Relevant CV Studies: Telemetry currently shows sinus tachycardia  Laboratory Data:  High Sensitivity Troponin:   Recent Labs  Lab 07/05/23 1531 07/05/23 1822 07/05/23 2200  TROPONINIHS 71* 79* 92*     Chemistry Recent Labs  Lab 07/05/23 1531  NA 135  K 4.0  CL 100  CO2 22  GLUCOSE 97  BUN 17  CREATININE 1.60*  CALCIUM  10.0  MG 1.8  GFRNONAA 38*   ANIONGAP 13    Recent Labs  Lab 07/05/23 1531  PROT 8.3*  ALBUMIN 4.5  AST 23  ALT 19  ALKPHOS 84  BILITOT 0.5   Lipids No results for input(s): "CHOL", "TRIG", "HDL", "LABVLDL", "LDLCALC", "CHOLHDL" in the last 168 hours.  Hematology Recent Labs  Lab 07/05/23 1531  WBC 16.3*  RBC 5.22*  HGB 17.1*  HCT 50.6*  MCV 96.9  MCH 32.8  MCHC 33.8  RDW 13.5  PLT 327   Thyroid  Recent Labs  Lab 07/05/23 2200  TSH 2.317    BNPNo results for input(s): "BNP", "PROBNP" in the last 168 hours.  DDimer No results for input(s): "DDIMER" in the last 168 hours.   Radiology/Studies:  CT Angio Chest/Abd/Pel for Dissection W and/or Wo Contrast Result Date: 07/05/2023 CLINICAL DATA:  Near syncope.  Hypertension.  On even pulses. EXAM: CT ANGIOGRAPHY CHEST, ABDOMEN AND PELVIS TECHNIQUE: Non-contrast CT of the chest was initially obtained. Multidetector CT imaging through the chest, abdomen and pelvis was performed using the standard protocol during bolus administration of intravenous contrast. Multiplanar reconstructed images and MIPs were obtained and reviewed to evaluate the vascular anatomy. RADIATION DOSE REDUCTION: This exam was performed according to the departmental dose-optimization program which includes automated exposure control, adjustment of the mA and/or kV according to patient size and/or use of iterative reconstruction technique. CONTRAST:  OMNIPAQUE  IOHEXOL  350 MG/ML SOLN COMPARISON:  CT abdomen and pelvis 10/06/2019. FINDINGS: CTA CHEST FINDINGS Cardiovascular: Preferential opacification of the thoracic aorta. No evidence of thoracic aortic aneurysm or dissection. Normal heart size. No pericardial effusion. Origin of the great vessels appears within normal limits. Mediastinum/Nodes: No enlarged mediastinal, hilar, or axillary lymph nodes. Thyroid gland, trachea, and esophagus demonstrate no significant findings. Lungs/Pleura: Lungs are clear. No pleural effusion or  pneumothorax. Musculoskeletal: No chest wall abnormality. No acute or significant osseous findings. Review of the MIP images confirms the above findings. CTA ABDOMEN AND PELVIS FINDINGS VASCULAR Aorta: Normal caliber aorta without aneurysm, dissection, vasculitis or significant stenosis. Celiac: Patent without evidence of aneurysm, dissection, vasculitis or significant stenosis. SMA: Patent without evidence of aneurysm, dissection, vasculitis or significant stenosis. Renals: Both renal arteries are patent without evidence of aneurysm, dissection, vasculitis, fibromuscular dysplasia or significant stenosis. IMA: Patent without evidence of aneurysm, dissection, vasculitis or significant stenosis. Inflow: Patent without evidence of aneurysm, dissection, vasculitis or significant stenosis. Veins: No obvious venous abnormality within the limitations of this arterial phase study. Review of the MIP images confirms the above findings. NON-VASCULAR Hepatobiliary: There is a 9 mm hypodense lesion in the left lobe of the liver favored as a cyst. Otherwise, the liver, gallbladder and bile ducts are within normal limits. Pancreas: Unremarkable. No pancreatic ductal dilatation or surrounding inflammatory changes. Spleen: Normal in size without focal abnormality. Adrenals/Urinary Tract: Adrenal glands are unremarkable. Kidneys are normal, without renal calculi, focal lesion, or hydronephrosis. Bladder is unremarkable. Stomach/Bowel: Stomach is within normal limits. Appendix appears normal. No evidence of bowel wall thickening, distention, or inflammatory changes. Lymphatic: No enlarged lymph nodes are seen. Reproductive: Uterus and left adnexa are within normal limits. There is a rounded cystic area in the right adnexa measuring 3.2 Cm likely related to the right ovary. Other: No abdominal wall hernia or abnormality. No abdominopelvic ascites. Musculoskeletal: No fracture is seen. Review of the MIP images confirms the above  findings. IMPRESSION: 1. No evidence for aortic aneurysm or dissection. 2. No acute localizing process in the chest, abdomen or pelvis. 3. 3.2 cm cystic area in the right adnexa likely related to the right ovary. Recommend follow-up US  in 6-12 months. Note: This recommendation does not apply to premenarchal patients and to those with increased risk (genetic, family history, elevated tumor markers or other high-risk factors) of ovarian cancer. Reference: JACR 2020 Feb; 17(2):248-254 Electronically Signed   By: Tyron Gallon M.D.   On: 07/05/2023 22:56     Assessment and Plan:   Presyncope likely secondary to dehydration in the setting of GI illness/gastroenteritis. T wave inversion in the inferior and anterolateral leads,  flat troponin 71/92, no chest pain likely secondary to demand ischemia AKI due to volume depletion   Plan: - Repeat ECG in a.m., continue IV fluids, hydration. Orthostatics not checked but patient is already received IV fluids.  Currently doing well normotensive. Obtain echocardiogram in the morning.  Patient denies any exertional chest pain or similar symptoms of dyspnea on exertion.  Her baseline functional status is excellent.  Currently denies any chest pain likely etiology of elevated flat troponin.  CT chest does not show any coronary calcification, she may benefit from further restratification as outpatient, once acute issues resolved either with  CCTA or stress test in the near future. - AKI should resolve with hydration We will follow  Risk Assessment/Risk Scores:                For questions or updates, please contact [facility] Please consult [email or web address] for contact info under    Signed, Cranston Dk, MD  07/06/2023 2:22 AM

## 2023-07-06 NOTE — H&P (Signed)
 History and Physical    Patient: Brenda Sweeney NWG:[id number] DOB: 1970/05/26 DOA: 07/05/2023 DOS: the patient was seen and examined on 07/06/2023 PCP: Patient, No Pcp Per  Patient coming from: Home  Chief Complaint:  Chief Complaint  Patient presents with   Near Syncope   HPI: Brenda Sweeney is a 54 y.o. female with medical history significant of none. Patient was in her usual state of helaht till yesteday AM . When she reports a new bout of diahrea at least 5 large episodes yestreday . Another 2 this AM. No blood. No fever, no vomting, no abd pain.   Patient was ather job today in [employer] and recalls feelig presyncopal all morning whenever she would get up and walk around. Finally her co-workers noted the faitgue of aptient and had her sit and drink gatorate. Subsequently, patinet had devleopment of spasms in both hands. She was taken to urgetn care facility, noted to be tachycardic to 100 , BP was 155/115. [facility]  Patinet has had no recurrent of spasm. S.p iv fluids. HR 69 and preysncope has resovled.  Patient denis any chest pain, sob, fever, cough, or leg swelling. She is hungry.   Troponin 71>79>92   T wave inversions on EKG.  CT chest/abd/pelvis:IMPRESSION: 1. No evidence for aortic aneurysm or dissection. 2. No acute localizing process in the chest, abdomen or pelvis. 3. 3.2 cm cystic area in the right adnexa likely related to the right ovary. Recommend follow-up US  in 6-12 months.  Medicaleval is sought.  Review of Systems: As mentioned in the history of present illness. All other systems reviewed and are negative. History reviewed. No pertinent past medical history. Past Surgical History:  Procedure Laterality Date   DILATATION & CURETTAGE/HYSTEROSCOPY WITH MYOSURE N/A 12/19/2019   Procedure: DILATATION & CURETTAGE, HYSTEROSCOPY, MYOMECTOMY;  Surgeon: Julianne Octave, MD;  Location: [facility];  Service:  Gynecology;  Laterality: N/A;   TUBAL LIGATION     Social History:  reports that she has been smoking cigarettes. She has a 7.5 pack-year smoking history. She has never used smokeless tobacco. She reports that she does not drink alcohol and does not use drugs.  Allergies  Allergen Reactions   Prozac [Fluoxetine Hcl] Other (See Comments)    Made my face lock up   Prednisone Other (See Comments)    Family History  Problem Relation Age of Onset   Hypertension Mother    Diabetes Mother    Diabetes Father    Hypertension Father    High Cholesterol Father    Diabetes Brother    Hypertension Brother    Diabetes Paternal Grandmother    Hypertension Paternal Grandmother     Prior to Admission medications   Medication Sig Start Date End Date Taking? Authorizing Provider  levocetirizine (XYZAL ) 5 MG tablet Take 1 tablet (5 mg total) by mouth every evening. 02/03/23 08/02/23 Yes Eloise Hake Scales, PA-C  Multiple Vitamin (MULTIVITAMIN) capsule Take 1 capsule by mouth daily.   Yes [provider]    Physical Exam: Vitals:   07/05/23 2215 07/05/23 2217 07/05/23 2330 07/05/23 2345  BP: (!) 158/130  (!) 162/94 (!) 140/74  Pulse: 71  68 69  Resp: 15  20 18   Temp:  97.8 F (36.6 C)    TempSrc:  Oral    SpO2: 100%  100% 100%  Weight:      Height:       General: Patient is alert awake oriented x  3 appears to be in no distress Respiratory exam: Bilateral intravesicular Cardiovascular exam S1 is normal Abdomen all quadrants soft nontender Extremities warm without edema.  No focal motor deficit Data Reviewed:  Labs on Admission:  Results for orders placed or performed during the hospital encounter of 07/05/23 (from the past 24 hours)  CBC with Differential     Status: Abnormal   Collection Time: 07/05/23  3:31 PM  Result Value Ref Range   WBC 16.3 (H) 4.0 - 10.5 K/uL   RBC 5.22 (H) 3.87 - 5.11 MIL/uL   Hemoglobin 17.1 (H) 12.0 - 15.0 g/dL   HCT 57.8 (H) 46.9 - 62.9 %    MCV 96.9 80.0 - 100.0 fL   MCH 32.8 26.0 - 34.0 pg   MCHC 33.8 30.0 - 36.0 g/dL   RDW 52.8 41.3 - 24.4 %   Platelets 327 150 - 400 K/uL   nRBC 0.0 0.0 - 0.2 %   Neutrophils Relative % 76 %   Neutro Abs 12.4 (H) 1.7 - 7.7 K/uL   Lymphocytes Relative 16 %   Lymphs Abs 2.6 0.7 - 4.0 K/uL   Monocytes Relative 7 %   Monocytes Absolute 1.2 (H) 0.1 - 1.0 K/uL   Eosinophils Relative 0 %   Eosinophils Absolute 0.0 0.0 - 0.5 K/uL   Basophils Relative 0 %   Basophils Absolute 0.1 0.0 - 0.1 K/uL   Immature Granulocytes 1 %   Abs Immature Granulocytes 0.10 (H) 0.00 - 0.07 K/uL  Comprehensive metabolic panel     Status: Abnormal   Collection Time: 07/05/23  3:31 PM  Result Value Ref Range   Sodium 135 135 - 145 mmol/L   Potassium 4.0 3.5 - 5.1 mmol/L   Chloride 100 98 - 111 mmol/L   CO2 22 22 - 32 mmol/L   Glucose, Bld 97 70 - 99 mg/dL   BUN 17 6 - 20 mg/dL   Creatinine, Ser 0.10 (H) 0.44 - 1.00 mg/dL   Calcium  10.0 8.9 - 10.3 mg/dL   Total Protein 8.3 (H) 6.5 - 8.1 g/dL   Albumin 4.5 3.5 - 5.0 g/dL   AST 23 15 - 41 U/L   ALT 19 0 - 44 U/L   Alkaline Phosphatase 84 38 - 126 U/L   Total Bilirubin 0.5 0.0 - 1.2 mg/dL   GFR, Estimated 38 (L) >60 mL/min   Anion gap 13 5 - 15  Troponin I (High Sensitivity)     Status: Abnormal   Collection Time: 07/05/23  3:31 PM  Result Value Ref Range   Troponin I (High Sensitivity) 71 (H) <18 ng/L  Magnesium     Status: None   Collection Time: 07/05/23  3:31 PM  Result Value Ref Range   Magnesium 1.8 1.7 - 2.4 mg/dL  Troponin I (High Sensitivity)     Status: Abnormal   Collection Time: 07/05/23  6:22 PM  Result Value Ref Range   Troponin I (High Sensitivity) 79 (H) <18 ng/L  TSH     Status: None   Collection Time: 07/05/23 10:00 PM  Result Value Ref Range   TSH 2.317 0.350 - 4.500 uIU/mL  Troponin I (High Sensitivity)     Status: Abnormal   Collection Time: 07/05/23 10:00 PM  Result Value Ref Range   Troponin I (High Sensitivity) 92 (H) <18  ng/L   Basic Metabolic Panel: Recent Labs  Lab 07/05/23 1531  NA 135  K 4.0  CL 100  CO2 22  GLUCOSE 97  BUN 17  CREATININE 1.60*  CALCIUM  10.0  MG 1.8   Liver Function Tests: Recent Labs  Lab 07/05/23 1531  AST 23  ALT 19  ALKPHOS 84  BILITOT 0.5  PROT 8.3*  ALBUMIN 4.5   No results for input(s): "LIPASE", "AMYLASE" in the last 168 hours. No results for input(s): "AMMONIA" in the last 168 hours. CBC: Recent Labs  Lab 07/05/23 1531  WBC 16.3*  NEUTROABS 12.4*  HGB 17.1*  HCT 50.6*  MCV 96.9  PLT 327   Cardiac Enzymes: Recent Labs  Lab 07/05/23 1531 07/05/23 1822 07/05/23 2200  TROPONINIHS 71* 79* 92*    BNP (last 3 results) No results for input(s): "PROBNP" in the last 8760 hours. CBG: No results for input(s): "GLUCAP" in the last 168 hours.  Radiological Exams on Admission:  CT Angio Chest/Abd/Pel for Dissection W and/or Wo Contrast Result Date: 07/05/2023 CLINICAL DATA:  Near syncope.  Hypertension.  On even pulses. EXAM: CT ANGIOGRAPHY CHEST, ABDOMEN AND PELVIS TECHNIQUE: Non-contrast CT of the chest was initially obtained. Multidetector CT imaging through the chest, abdomen and pelvis was performed using the standard protocol during bolus administration of intravenous contrast. Multiplanar reconstructed images and MIPs were obtained and reviewed to evaluate the vascular anatomy. RADIATION DOSE REDUCTION: This exam was performed according to the departmental dose-optimization program which includes automated exposure control, adjustment of the mA and/or kV according to patient size and/or use of iterative reconstruction technique. CONTRAST:  OMNIPAQUE  IOHEXOL  350 MG/ML SOLN COMPARISON:  CT abdomen and pelvis 10/06/2019. FINDINGS: CTA CHEST FINDINGS Cardiovascular: Preferential opacification of the thoracic aorta. No evidence of thoracic aortic aneurysm or dissection. Normal heart size. No pericardial effusion. Origin of the great vessels appears  within normal limits. Mediastinum/Nodes: No enlarged mediastinal, hilar, or axillary lymph nodes. Thyroid gland, trachea, and esophagus demonstrate no significant findings. Lungs/Pleura: Lungs are clear. No pleural effusion or pneumothorax. Musculoskeletal: No chest wall abnormality. No acute or significant osseous findings. Review of the MIP images confirms the above findings. CTA ABDOMEN AND PELVIS FINDINGS VASCULAR Aorta: Normal caliber aorta without aneurysm, dissection, vasculitis or significant stenosis. Celiac: Patent without evidence of aneurysm, dissection, vasculitis or significant stenosis. SMA: Patent without evidence of aneurysm, dissection, vasculitis or significant stenosis. Renals: Both renal arteries are patent without evidence of aneurysm, dissection, vasculitis, fibromuscular dysplasia or significant stenosis. IMA: Patent without evidence of aneurysm, dissection, vasculitis or significant stenosis. Inflow: Patent without evidence of aneurysm, dissection, vasculitis or significant stenosis. Veins: No obvious venous abnormality within the limitations of this arterial phase study. Review of the MIP images confirms the above findings. NON-VASCULAR Hepatobiliary: There is a 9 mm hypodense lesion in the left lobe of the liver favored as a cyst. Otherwise, the liver, gallbladder and bile ducts are within normal limits. Pancreas: Unremarkable. No pancreatic ductal dilatation or surrounding inflammatory changes. Spleen: Normal in size without focal abnormality. Adrenals/Urinary Tract: Adrenal glands are unremarkable. Kidneys are normal, without renal calculi, focal lesion, or hydronephrosis. Bladder is unremarkable. Stomach/Bowel: Stomach is within normal limits. Appendix appears normal. No evidence of bowel wall thickening, distention, or inflammatory changes. Lymphatic: No enlarged lymph nodes are seen. Reproductive: Uterus and left adnexa are within normal limits. There is a rounded cystic area in the  right adnexa measuring 3.2 Cm likely related to the right ovary. Other: No abdominal wall hernia or abnormality. No abdominopelvic ascites. Musculoskeletal: No fracture is seen. Review of the MIP images confirms the above findings. IMPRESSION: 1. No evidence for aortic aneurysm or  dissection. 2. No acute localizing process in the chest, abdomen or pelvis. 3. 3.2 cm cystic area in the right adnexa likely related to the right ovary. Recommend follow-up US  in 6-12 months. Note: This recommendation does not apply to premenarchal patients and to those with increased risk (genetic, family history, elevated tumor markers or other high-risk factors) of ovarian cancer. Reference: JACR 2020 Feb; 17(2):248-254 Electronically Signed   By: Tyron Gallon M.D.   On: 07/05/2023 22:56    chest X-ray  EKG: Independently reviewed. New T wave inversions.  No intake/output data recorded. Total I/O In: 500 [IV Piggyback:500] Out: -       Assessment and Plan: * Troponin I above reference range Trending up ever so slightly. Simplest explanation of elevation is aki and 'demand ischemia" due to dehydration/hypotension/preysncpe. CT : no dissection. T/r/o myocarditis and acs - new T wave inversions on EKG but no downsloping ST segment.. Trend tropnin in AM. Keep on telemetry. Chek echo. Given lack of symptoms, acs is felt to be unlikely.  Cramps, extremity Patient had cramps of hands prior to presentaton . Calcium  wnl. Magnesium wnl. K wnl. No recurrence. Monitor clinically.  Ovarian mass Out patient gyn referral.  Enteritis Resolved earleir this AM, prior to presentation per patient.   Postural dizziness with presyncope Curnlty resolved. C.w. telemtry echo . Likely due to dehdyration.  Dehydration Evident by aKI, erythrocytosis andhistory of diarrhea. Tachycardia and h/o preysncope. S.p 500 cc fluid. Will give another liter iv and infusion 125/hr. Recheck parameters in AM      Advance Care Planning:    Code Status: Full Code   Consults: I have sent a message to Cardiology Dr. Jeryl Moris for help give new T wave inversions.  Family Communication: per patient.  Severity of Illness: The appropriate patient status for this patient is OBSERVATION. Observation status is judged to be reasonable and necessary in order to provide the required intensity of service to ensure the patient's safety. The patient's presenting symptoms, physical exam findings, and initial radiographic and laboratory data in the context of their medical condition is felt to place them at decreased risk for further clinical deterioration. Furthermore, it is anticipated that the patient will be medically stable for discharge from the hospital within 2 midnights of admission.   Author: Bennie Brave, MD 07/06/2023 12:35 AM  For on call review [email or web address].

## 2023-07-06 NOTE — Assessment & Plan Note (Addendum)
-   Seems to have resolved quickly on admission - If any further diarrhea, will test

## 2023-07-06 NOTE — Progress Notes (Signed)
 2 week live zio ordered, will need to call EKG department immediately before discharge tomorrow to apply to the patient.

## 2023-07-06 NOTE — Assessment & Plan Note (Signed)
LDL 115

## 2023-07-06 NOTE — Plan of Care (Signed)
  Problem: Education: Goal: Knowledge of General Education information will improve Description: Including pain rating scale, medication(s)/side effects and non-pharmacologic comfort measures Outcome: Progressing   Problem: Health Behavior/Discharge Planning: Goal: Ability to manage health-related needs will improve Outcome: Progressing   Problem: Activity: Goal: Risk for activity intolerance will decrease Outcome: Progressing   Problem: Nutrition: Goal: Adequate nutrition will be maintained Outcome: Progressing   Problem: Coping: Goal: Level of anxiety will decrease Outcome: Progressing   Problem: Elimination: Goal: Will not experience complications related to bowel motility Outcome: Progressing Goal: Will not experience complications related to urinary retention Outcome: Progressing   Problem: Pain Managment: Goal: General experience of comfort will improve and/or be controlled Outcome: Progressing

## 2023-07-07 ENCOUNTER — Inpatient Hospital Stay (HOSPITAL_BASED_OUTPATIENT_CLINIC_OR_DEPARTMENT_OTHER)
Admit: 2023-07-07 | Discharge: 2023-07-07 | Disposition: A | Discharge status: Home / Self Care | Attending: Cardiology | Admitting: Cardiology

## 2023-07-07 DIAGNOSIS — R079 Chest pain, unspecified: Secondary | ICD-10-CM | POA: Diagnosis not present

## 2023-07-07 DIAGNOSIS — I499 Cardiac arrhythmia, unspecified: Secondary | ICD-10-CM

## 2023-07-07 DIAGNOSIS — N179 Acute kidney failure, unspecified: Secondary | ICD-10-CM | POA: Diagnosis not present

## 2023-07-07 DIAGNOSIS — I159 Secondary hypertension, unspecified: Secondary | ICD-10-CM | POA: Diagnosis not present

## 2023-07-07 LAB — GLUCOSE, CAPILLARY: Glucose-Capillary: 106 mg/dL — ABNORMAL HIGH (ref 70–99)

## 2023-07-07 MED ORDER — AMLODIPINE BESYLATE 5 MG PO TABS: 5.0000 mg | ORAL_TABLET | Freq: Every day | ORAL | 3 refills | Status: DC

## 2023-07-07 MED ORDER — ROSUVASTATIN CALCIUM 5 MG PO TABS: 5.0000 mg | ORAL_TABLET | Freq: Every day | ORAL | 3 refills | Status: DC

## 2023-07-07 NOTE — Progress Notes (Signed)
 Discussed with hospitalist, patient will be discharged with a Zio live.  Cardiology fu scheduled.

## 2023-07-07 NOTE — Progress Notes (Signed)
   07/07/23 1100  Spiritual Encounters  Type of Visit Initial  Care provided to: Patient  Reason for visit Advance directives  OnCall Visit No    Chaplain responded to consult request for Advance Care Directives. The document was ready to be notarized. The document was notarized in the presence of a notary and two witnesses. The original was given to the patient, two copies were given to the patient's brother and son, and another copy was filed in the patient's own folder.    M.Kubra Welby Hale Resident [phone]

## 2023-07-07 NOTE — Care Management (Signed)
  Transition of Care (TOC) Screening Note   Patient Details  Name: Brenda Sweeney Date of Birth: 14-Feb-1971   Transition of Care [facility]) CM/SW Contact:    Ronni Colace, RN Phone Number: 07/07/2023, 10:44 AM   PCP information on AVS Transition of Care Department (TOC) has reviewed patient and no TOC needs have been identified at this time. We will continue to monitor patient advancement through interdisciplinary progression rounds. If new patient transition needs arise, please place a TOC consult.

## 2023-07-07 NOTE — Plan of Care (Signed)

## 2023-07-07 NOTE — Discharge Summary (Signed)
 Physician Discharge Summary   Brenda Sweeney ZOX:[id number] DOB: Feb 17, 1971 DOA: 07/05/2023  PCP: Brenda Sweeney  Admit date: 07/05/2023 Discharge date: 07/07/2023  Admitted From: Home Disposition: Home Discharging physician: Faith Homes, MD Barriers to discharge: None  Recommendations at discharge: Repeat lipid panel in about 6 months Repeat pelvic ultrasound in 6 to 12 months to evaluate right adnexa   Discharge Condition: stable CODE STATUS: Full  Diet recommendation:  Diet Orders (From admission, onward)     Start     Ordered   07/07/23 0000  Diet general        07/07/23 0952   07/06/23 0031  Diet regular Room service appropriate? Yes; Fluid consistency: Thin  Diet effective now       Question Answer Comment  Room service appropriate? Yes   Fluid consistency: Thin      07/06/23 0030            Hospital Course: Brenda Sweeney is a 53 yo female with PMH HTN, pre-diabetes who presented with an episode of diarrhea and presyncope.  She was found to be hypertensive on workup with indeterminant elevated troponins.  EKG showed T wave inversions.  She was admitted for cardiology evaluation and further workup.  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 - possibly BP mediated but patient also recommended for Zio at discharge [facility] - outpt follow up with cardiology planned - CP had resolved completely to discharge   HTN (hypertension) - Has been elevated in the past and found to be as high as 186/96 on admission with persistently high diastolic pressures - Started on amlodipine  for now [facility] cardiology  AKI (acute kidney injury) (HCC)-resolved as of 07/07/2023 - baseline creatinine ~ 1 - patient presents with increase in creat >0.3 mg/dL  above baseline or creat increase >1.5x baseline presumed to have occurred within past 7 days PTA - creat 1.6 on admission - presumed pre-renal; improved with IVF; some diarrhea PTA - now back to baseline; continue diet    Ovarian cyst Sweeney CT: 3.2 cm cystic area in the right adnexa likely related to the right ovary. Recommend follow-up US  in 6-12 months  Prediabetes - Repeat A1c 5.6% on admission - Continue diet control  HLD (hyperlipidemia) - LDL 115 - Started on Crestor  5 mg daily [facility] cardiology  Cramps, extremity-resolved as of 07/06/2023 Patient had cramps of hands prior to presentaton . Calcium  wnl. Magnesium wnl. K wnl. No recurrence. Monitor clinically.  Enteritis-resolved as of 07/06/2023 - Seems to have resolved quickly on admission - If any further diarrhea, will test  Postural dizziness with presyncope-resolved as of 07/06/2023 Curnlty resolved. C.w. telemtry echo . Likely due to dehydration and possibly from GI losses    The patient's acute and chronic medical conditions were treated accordingly. On day of discharge, patient was felt deemed stable for discharge. Patient/family member advised to call PCP or come back to ER if needed.   Principal Diagnosis: Chest pain  Discharge Diagnoses: Active Hospital Problems   Diagnosis Date Noted   HTN (hypertension) 07/06/2023    Priority: 2.   Ovarian cyst 07/06/2023    Priority: 3.   Prediabetes 02/05/2020   HLD (hyperlipidemia) 02/05/2020    Resolved Hospital Problems   Diagnosis Date Noted Date Resolved   Chest pain 07/06/2023 07/07/2023  Priority: 1.   AKI (acute kidney injury) (HCC) 07/06/2023 07/07/2023    Priority: 2.   Postural dizziness with presyncope 07/06/2023 07/06/2023   Enteritis 07/06/2023 07/06/2023   Cramps, extremity 07/06/2023 07/06/2023     Discharge Instructions     Diet general   Complete by: As directed    Increase activity slowly   Complete by: As directed       Allergies as of  07/07/2023       Reactions   Prozac [fluoxetine Hcl] Other (See Comments)   Made my face lock up   Prednisone Other (See Comments)        Medication List     TAKE these medications    amLODipine  5 MG tablet Commonly known as: NORVASC  Take 1 tablet (5 mg total) by mouth daily.   levocetirizine 5 MG tablet Commonly known as: XYZAL  Take 1 tablet (5 mg total) by mouth every evening.   multivitamin capsule Take 1 capsule by mouth daily.   rosuvastatin  5 MG tablet Commonly known as: CRESTOR  Take 1 tablet (5 mg total) by mouth at bedtime.        Follow-up Information     [facility] at [facility] Follow up.   Specialty: Family Medicine Why: please call to schedule a appt for follow up. and establish  a primary care doctor Contact information: [address] [place]  [address] [phone] Additional information: [address]  [address]  [place], [place] [address]    - Parking lot with [employer], [employer], [employer]; Next to [facility]   - [facility] shopping - Off of [address]                 Allergies  Allergen Reactions   Prozac [Fluoxetine Hcl] Other (See Comments)    Made my face lock up   Prednisone Other (See Comments)    Consultations: Cardiology  Procedures:   Discharge Exam: BP (!) 140/95 (BP Location: Left Arm)   Pulse 77   Temp 98.5 F (36.9 C) (Oral)   Resp 19   Ht 5\' 5"  (1.651 m)   Wt 83.5 kg   SpO2 100%   BMI 30.63 kg/m  Physical Exam Constitutional:      Appearance: Normal appearance.  HENT:     Head: Normocephalic and atraumatic.     Mouth/Throat:     Mouth: Mucous membranes are moist.  Eyes:     Extraocular Movements: Extraocular movements intact.  Cardiovascular:     Rate and Rhythm: Normal rate and regular rhythm.  Pulmonary:     Effort: Pulmonary effort is normal. No respiratory distress.     Breath sounds: Normal breath sounds. No  wheezing.  Abdominal:     General: Bowel sounds are normal. There is no distension.     Palpations: Abdomen is soft.     Tenderness: There is no abdominal tenderness.  Musculoskeletal:        General: Normal range of motion.     Cervical back: Normal range of motion and neck supple.  Skin:    General: Skin is warm and dry.  Neurological:     General: No focal deficit present.     Mental Status: She is alert.  Psychiatric:        Mood and Affect: Mood normal.      The results of significant diagnostics from this hospitalization (including imaging, microbiology, ancillary and laboratory) are listed below for reference.  Microbiology: No results found for this or any previous visit (from the past 240 hours).   Labs: BNP (last 3 results) No results for input(s): "BNP" in the last 8760 hours. Basic Metabolic Panel: Recent Labs  Lab 07/05/23 1531 07/06/23 0642  NA 135 141  K 4.0 3.6  CL 100 103  CO2 22 23  GLUCOSE 97 109*  BUN 17 10  CREATININE 1.60* 1.03*  CALCIUM  10.0 10.1  MG 1.8  --    Liver Function Tests: Recent Labs  Lab 07/05/23 1531  AST 23  ALT 19  ALKPHOS 84  BILITOT 0.5  PROT 8.3*  ALBUMIN 4.5   No results for input(s): "LIPASE", "AMYLASE" in the last 168 hours. No results for input(s): "AMMONIA" in the last 168 hours. CBC: Recent Labs  Lab 07/05/23 1531 07/06/23 0642  WBC 16.3* 10.7*  NEUTROABS 12.4*  --   HGB 17.1* 15.1*  HCT 50.6* 43.0  MCV 96.9 94.3  PLT 327 260   Cardiac Enzymes: No results for input(s): "CKTOTAL", "CKMB", "CKMBINDEX", "TROPONINI" in the last 168 hours. BNP: Invalid input(s): "POCBNP" CBG: Recent Labs  Lab 07/07/23 0727  GLUCAP 106*   D-Dimer No results for input(s): "DDIMER" in the last 72 hours. Hgb A1c Recent Labs    07/06/23 1000  HGBA1C 5.6   Lipid Profile Recent Labs    07/06/23 0930  CHOL 168  HDL 34*  LDLCALC 115*  TRIG 93  CHOLHDL 4.9   Thyroid function studies Recent Labs     07/05/23 2200  TSH 2.317   Anemia work up No results for input(s): "VITAMINB12", "FOLATE", "FERRITIN", "TIBC", "IRON", "RETICCTPCT" in the last 72 hours. Urinalysis    Component Value Date/Time   COLORURINE YELLOW 12/12/2019 1135   APPEARANCEUR HAZY (A) 12/12/2019 1135   LABSPEC 1.020 03/29/2021 1221   PHURINE 5.5 03/29/2021 1221   GLUCOSEU NEGATIVE 03/29/2021 1221   HGBUR LARGE (A) 03/29/2021 1221   BILIRUBINUR NEGATIVE 03/29/2021 1221   KETONESUR NEGATIVE 03/29/2021 1221   PROTEINUR NEGATIVE 03/29/2021 1221   UROBILINOGEN 0.2 03/29/2021 1221   NITRITE NEGATIVE 03/29/2021 1221   LEUKOCYTESUR TRACE (A) 03/29/2021 1221   Sepsis Labs Recent Labs  Lab 07/05/23 1531 07/06/23 0642  WBC 16.3* 10.7*   Microbiology No results found for this or any previous visit (from the past 240 hours).  Procedures/Studies: CT CORONARY MORPH W/CTA COR W/SCORE W/CA W/CM &/OR WO/CM Addendum Date: 07/07/2023 ADDENDUM REPORT: 07/07/2023 09:44 EXAM: OVER-READ INTERPRETATION  CT CHEST The following report is an over-read performed by radiologist Dr. Violeta Grey of [facility] on 07/07/2023. This over-read does not include interpretation of cardiac or coronary anatomy or pathology. The coronary calcium  score/coronary CTA interpretation by the cardiologist is attached. COMPARISON:  Chest CT from the previous day. FINDINGS: Cardiovascular: There are no significant extracardiac vascular findings. Mediastinum/Nodes: There are no enlarged lymph nodes within the visualized mediastinum. Lungs/Pleura: There is no pleural effusion. The visualized lungs appear clear. Upper abdomen: No significant findings in the visualized upper abdomen. Musculoskeletal/Chest wall: No chest wall mass or suspicious osseous findings within the visualized chest. IMPRESSION: No significant extracardiac findings within the visualized chest. Electronically Signed   By: Violeta Grey M.D.   On: 07/07/2023 09:44   Result Date:  07/07/2023 CLINICAL DATA:  Chest pain EXAM: Cardiac/Coronary CTA TECHNIQUE: A non-contrast, gated CT scan was obtained with axial slices of 3 mm through the heart for calcium  scoring. Calcium  scoring was performed using the Agatston method. A 90 kV prospective,  gated, contrast cardiac scan was obtained. Gantry rotation speed was 250 msecs and collimation was 0.6 mm. Two sublingual nitroglycerin  tablets (0.8 mg) were given. The 3D data set was reconstructed in 5% intervals of the 35-75% of the R-R cycle. Diastolic phases were analyzed on a dedicated workstation using MPR, MIP, and VRT modes. The patient received 95 cc of contrast. FINDINGS: Image quality: Excellent. Noise artifact is: Limited. Coronary Arteries:  Normal coronary origin.  Right dominance. Left main: The left main is a large caliber vessel with a normal take off from the left coronary cusp that trifurcates into a LAD, LCX, and ramus intermedius. There is no plaque or stenosis. Left anterior descending artery: The LAD is patent without evidence of plaque or stenosis. The LAD gives off 1 patent diagonal branch. Ramus intermedius: Mild non-calcified plaque (25-49%). Left circumflex artery: The LCX is non-dominant. There is mild non-calcified plaque (25-49%) in the proximal segment. The LCX gives off 1 patent obtuse marginal branch. Right coronary artery: The RCA is dominant with normal take off from the right coronary cusp. There is minimal non-calcified plaque (<25%). The RCA terminates as a PDA and right posterolateral branch without evidence of plaque or stenosis. Right Atrium: Right atrial size is within normal limits. Right Ventricle: The right ventricular cavity is within normal limits. Left Atrium: Left atrial size is normal in size with no left atrial appendage filling defect. Left Ventricle: The ventricular cavity size is within normal limits. Pulmonary arteries: Normal in size. Pulmonary veins: Normal pulmonary venous drainage. Pericardium:  Normal thickness without significant effusion or calcium  present. Cardiac valves: The aortic valve is trileaflet without significant calcification. The mitral valve is normal without significant calcification. Aorta: Normal caliber without significant disease. Extra-cardiac findings: See attached radiology report for non-cardiac structures. IMPRESSION: 1. Coronary calcium  score of 0. 2. Normal coronary origin with right dominance. 3. Mild non-calcified plaque in the proximal LCX (25-49%). 4. Mild non-calcified plaque (25-49%) in the ramus. RECOMMENDATIONS: 1. CAD-RADS 2: Mild non-obstructive CAD (25-49%). Consider non-atherosclerotic causes of chest pain. Consider preventive therapy and risk factor modification. Jackquelyn Mass, MD Electronically Signed: By: Jackquelyn Mass M.D. On: 07/06/2023 14:43   ECHOCARDIOGRAM COMPLETE Result Date: 07/06/2023    ECHOCARDIOGRAM REPORT   Patient Name:   Alegra Rost Redding-Williams Date of Exam: 07/06/2023 Medical Rec #:  [id number]                     Height:       65.0 in Accession #:    [id number]                    Weight:       184.1 lb Date of Birth:  [birth year]                      BSA:          1.910 m Patient Age:    53 years                      BP:           155/81 mmHg Patient Gender: F                             HR:           68 bpm. Exam Location:  Inpatient Procedure: 2D Echo, Cardiac Doppler and Color Doppler (Both Spectral  and Color            Flow Doppler were utilized during procedure). Indications:    Elevated Troponin  History:        Patient has no prior history of Echocardiogram examinations.  Sonographer:    Astrid Blamer Referring Phys: Bennie Brave IMPRESSIONS  1. Left ventricular ejection fraction, by estimation, is 60 to 65%. The left ventricle has normal function. The left ventricle has no regional wall motion abnormalities. Left ventricular diastolic parameters are indeterminate.  2. Right ventricular systolic function is normal. The right ventricular  size is normal. Tricuspid regurgitation signal is inadequate for assessing PA pressure.  3. The mitral valve is normal in structure. No evidence of mitral valve regurgitation. No evidence of mitral stenosis.  4. The aortic valve is tricuspid. Aortic valve regurgitation is not visualized. Aortic valve sclerosis/calcification is present, without any evidence of aortic stenosis. Aortic valve area, by VTI measures 2.77 cm. Aortic valve mean gradient measures 6.0 mmHg. Aortic valve Vmax measures 1.64 m/s.  5. The inferior vena cava is normal in size with greater than 50% respiratory variability, suggesting right atrial pressure of 3 mmHg. FINDINGS  Left Ventricle: Left ventricular ejection fraction, by estimation, is 60 to 65%. The left ventricle has normal function. The left ventricle has no regional wall motion abnormalities. The left ventricular internal cavity size was normal in size. There is  no left ventricular hypertrophy. Left ventricular diastolic parameters are indeterminate. Normal left ventricular filling pressure. Right Ventricle: The right ventricular size is normal. No increase in right ventricular wall thickness. Right ventricular systolic function is normal. Tricuspid regurgitation signal is inadequate for assessing PA pressure. Left Atrium: Left atrial size was normal in size. Right Atrium: Right atrial size was normal in size. Pericardium: Trivial pericardial effusion is present. The pericardial effusion is anterior to the right ventricle. Mitral Valve: The mitral valve is normal in structure. No evidence of mitral valve regurgitation. No evidence of mitral valve stenosis. Tricuspid Valve: The tricuspid valve is normal in structure. Tricuspid valve regurgitation is not demonstrated. No evidence of tricuspid stenosis. Aortic Valve: The aortic valve is tricuspid. Aortic valve regurgitation is not visualized. Aortic valve sclerosis/calcification is present, without any evidence of aortic stenosis.  Aortic valve mean gradient measures 6.0 mmHg. Aortic valve peak gradient measures 10.8 mmHg. Aortic valve area, by VTI measures 2.77 cm. Pulmonic Valve: The pulmonic valve was normal in structure. Pulmonic valve regurgitation is not visualized. No evidence of pulmonic stenosis. Aorta: The aortic root is normal in size and structure. Venous: The inferior vena cava is normal in size with greater than 50% respiratory variability, suggesting right atrial pressure of 3 mmHg. IAS/Shunts: No atrial level shunt detected by color flow Doppler.  LEFT VENTRICLE PLAX 2D LVIDd:         4.20 cm   Diastology LVIDs:         2.80 cm   LV e' medial:    6.85 cm/s LV PW:         1.10 cm   LV E/e' medial:  10.7 LV IVS:        1.00 cm   LV e' lateral:   11.20 cm/s LVOT diam:     1.90 cm   LV E/e' lateral: 6.6 LV SV:         82 LV SV Index:   43 LVOT Area:     2.84 cm  RIGHT VENTRICLE RV S prime:     15.80 cm/s TAPSE (  M-mode): 2.9 cm LEFT ATRIUM             Index        RIGHT ATRIUM           Index LA Vol (A2C):   53.5 ml 28.02 ml/m  RA Area:     12.50 cm LA Vol (A4C):   41.3 ml 21.63 ml/m  RA Volume:   29.00 ml  15.19 ml/m LA Biplane Vol: 47.8 ml 25.03 ml/m  AORTIC VALVE AV Area (Vmax):    2.44 cm AV Area (Vmean):   2.30 cm AV Area (VTI):     2.77 cm AV Vmax:           164.00 cm/s AV Vmean:          115.000 cm/s AV VTI:            0.295 m AV Peak Grad:      10.8 mmHg AV Mean Grad:      6.0 mmHg LVOT Vmax:         141.00 cm/s LVOT Vmean:        93.300 cm/s LVOT VTI:          0.288 m LVOT/AV VTI ratio: 0.98  AORTA Ao Root diam: 2.60 cm MITRAL VALVE MV Area (PHT): 3.08 cm    SHUNTS MV Decel Time: 246 msec    Systemic VTI:  0.29 m MV E velocity: 73.40 cm/s  Systemic Diam: 1.90 cm MV A velocity: 67.60 cm/s MV E/A ratio:  1.09 Gaylyn Keas MD Electronically signed by Gaylyn Keas MD Signature Date/Time: 07/06/2023/11:50:30 AM    Final    CT Angio Chest/Abd/Pel for Dissection W and/or Wo Contrast Result Date: 07/05/2023 CLINICAL  DATA:  Near syncope.  Hypertension.  On even pulses. EXAM: CT ANGIOGRAPHY CHEST, ABDOMEN AND PELVIS TECHNIQUE: Non-contrast CT of the chest was initially obtained. Multidetector CT imaging through the chest, abdomen and pelvis was performed using the standard protocol during bolus administration of intravenous contrast. Multiplanar reconstructed images and MIPs were obtained and reviewed to evaluate the vascular anatomy. RADIATION DOSE REDUCTION: This exam was performed according to the departmental dose-optimization program which includes automated exposure control, adjustment of the mA and/or kV according to patient size and/or use of iterative reconstruction technique. CONTRAST:  OMNIPAQUE  IOHEXOL  350 MG/ML SOLN COMPARISON:  CT abdomen and pelvis 10/06/2019. FINDINGS: CTA CHEST FINDINGS Cardiovascular: Preferential opacification of the thoracic aorta. No evidence of thoracic aortic aneurysm or dissection. Normal heart size. No pericardial effusion. Origin of the great vessels appears within normal limits. Mediastinum/Nodes: No enlarged mediastinal, hilar, or axillary lymph nodes. Thyroid gland, trachea, and esophagus demonstrate no significant findings. Lungs/Pleura: Lungs are clear. No pleural effusion or pneumothorax. Musculoskeletal: No chest wall abnormality. No acute or significant osseous findings. Review of the MIP images confirms the above findings. CTA ABDOMEN AND PELVIS FINDINGS VASCULAR Aorta: Normal caliber aorta without aneurysm, dissection, vasculitis or significant stenosis. Celiac: Patent without evidence of aneurysm, dissection, vasculitis or significant stenosis. SMA: Patent without evidence of aneurysm, dissection, vasculitis or significant stenosis. Renals: Both renal arteries are patent without evidence of aneurysm, dissection, vasculitis, fibromuscular dysplasia or significant stenosis. IMA: Patent without evidence of aneurysm, dissection, vasculitis or significant stenosis. Inflow:  Patent without evidence of aneurysm, dissection, vasculitis or significant stenosis. Veins: No obvious venous abnormality within the limitations of this arterial phase study. Review of the MIP images confirms the above findings. NON-VASCULAR Hepatobiliary: There is a 9 mm hypodense lesion in the left  lobe of the liver favored as a cyst. Otherwise, the liver, gallbladder and bile ducts are within normal limits. Pancreas: Unremarkable. No pancreatic ductal dilatation or surrounding inflammatory changes. Spleen: Normal in size without focal abnormality. Adrenals/Urinary Tract: Adrenal glands are unremarkable. Kidneys are normal, without renal calculi, focal lesion, or hydronephrosis. Bladder is unremarkable. Stomach/Bowel: Stomach is within normal limits. Appendix appears normal. No evidence of bowel wall thickening, distention, or inflammatory changes. Lymphatic: No enlarged lymph nodes are seen. Reproductive: Uterus and left adnexa are within normal limits. There is a rounded cystic area in the right adnexa measuring 3.2 Cm likely related to the right ovary. Other: No abdominal wall hernia or abnormality. No abdominopelvic ascites. Musculoskeletal: No fracture is seen. Review of the MIP images confirms the above findings. IMPRESSION: 1. No evidence for aortic aneurysm or dissection. 2. No acute localizing process in the chest, abdomen or pelvis. 3. 3.2 cm cystic area in the right adnexa likely related to the right ovary. Recommend follow-up US  in 6-12 months. Note: This recommendation does not apply to premenarchal patients and to those with increased risk (genetic, family history, elevated tumor markers or other high-risk factors) of ovarian cancer. Reference: JACR 2020 Feb; 17(2):248-254 Electronically Signed   By: Tyron Gallon M.D.   On: 07/05/2023 22:56     Time coordinating discharge: Over 30 minutes    Faith Homes, MD  [facility] 07/07/2023, 3:12 PM

## 2023-07-25 ENCOUNTER — Encounter: Payer: Self-pay | Admitting: Emergency Medicine

## 2023-07-25 ENCOUNTER — Ambulatory Visit: Attending: Emergency Medicine | Admitting: Emergency Medicine

## 2023-07-25 VITALS — BP 142/80 | HR 78 | Ht 65.0 in | Wt 195.0 lb

## 2023-07-25 DIAGNOSIS — I159 Secondary hypertension, unspecified: Secondary | ICD-10-CM | POA: Diagnosis not present

## 2023-07-25 DIAGNOSIS — I10 Essential (primary) hypertension: Secondary | ICD-10-CM | POA: Diagnosis not present

## 2023-07-25 DIAGNOSIS — I2510 Atherosclerotic heart disease of native coronary artery without angina pectoris: Secondary | ICD-10-CM | POA: Insufficient documentation

## 2023-07-25 DIAGNOSIS — R55 Syncope and collapse: Secondary | ICD-10-CM | POA: Diagnosis not present

## 2023-07-25 DIAGNOSIS — I499 Cardiac arrhythmia, unspecified: Secondary | ICD-10-CM | POA: Diagnosis not present

## 2023-07-25 DIAGNOSIS — E785 Hyperlipidemia, unspecified: Secondary | ICD-10-CM | POA: Insufficient documentation

## 2023-07-25 DIAGNOSIS — N179 Acute kidney failure, unspecified: Secondary | ICD-10-CM | POA: Diagnosis not present

## 2023-07-25 DIAGNOSIS — R0789 Other chest pain: Secondary | ICD-10-CM | POA: Diagnosis not present

## 2023-07-25 MED ORDER — AMLODIPINE BESYLATE 10 MG PO TABS: 10.0000 mg | ORAL_TABLET | Freq: Every day | ORAL | 3 refills | Status: AC

## 2023-07-25 MED ORDER — ROSUVASTATIN CALCIUM 10 MG PO TABS: 10.0000 mg | ORAL_TABLET | Freq: Every day | ORAL | 3 refills | Status: DC

## 2023-07-25 NOTE — Patient Instructions (Signed)
 Medication Instructions:  INCREASE YOUR CRESTOR  TO 10 MG DAILY. INCREASE YOUR AMLODIPINE  TO 10 MG DAILY.   Lab Work: BMET TO BE DONE TODAY. FASTING LIPID PANEL AND LFTs TO BE DONE IN 12 WEEKS.   Testing/Procedures: NONE  Follow-Up: At [facility], you and your health needs are our priority.  As part of our continuing mission to provide you with exceptional heart care, our providers are all part of one team.  This team includes your primary Cardiologist (physician) and Advanced Practice Providers or APPs (Physician Assistants and Nurse Practitioners) who all work together to provide you with the care you need, when you need it.  Your next appointment:   6 MONTHS  Provider:   Kardie Tobb, DO OR Palmer Bobo, [place]

## 2023-07-25 NOTE — Progress Notes (Unsigned)
 Cardiology Office Note:    Date:  07/26/2023  ID:  Brenda Sweeney, DOB 1971-02-21, MRN [id number] PCP: Patient, No Pcp Per   [facility] Providers Cardiologist:  Jerryl Morin, DO       Patient Profile:      Chief Complaint: Hospital follow-up for near syncope and hypertension History of Present Illness:  Brenda Sweeney is a 53 y.o. female with visit-pertinent history of coronary artery disease, hypertension, tobacco abuse  Patient was admitted to the hospital from 4/22 - 07/07/2023 for presyncope, chest pain, hypertension, and AKI.  Patient was seen by cardiology service on 07/06/2023 for further evaluation of presyncope, chest pains, and EKG changes.  Patient had no medical history prior to being seen.  Initial EKG showed sinus tachycardia with nonspecific ST-T wave changes.  Subsequent EKG showed diffuse T wave inversions in inferior and anterior lateral leads.  She had noted she was at work the day prior began to feel dizzy, lightheaded, numb, with associated chest pain.  She was having diarrhea and stomach cramps the day prior.  Her troponins were 71, 79, 92, 52.  Chest CTA chest did not show any coronary calcifications, no pericardial effusion, no evidence of aortic aneurysm or dissection.  Echocardiogram was ordered and completed on 07/06/2023 showing LVEF of 60 to 65%, no RWMA, RV function and size normal, no valvular abnormalities.  Coronary CTA was ordered and completed on 07/06/2023 showing coronary calcium  score of 0 with mild noncalcified plaque in the proximal LCx (25-49%) and mild noncalcified plaque in the ramus (25-29%).  Thought to be possibly BP mediated or related to dehydration.  Chest pain had completely resolved at discharge.  She was discharged home on 2-week live ZIO monitor.   Discussed the use of AI scribe software for clinical note transcription with the patient, who gave verbal consent to proceed.  History of Present Illness Brenda Sweeney is a 53 year old female with coronary artery disease who presents with a near syncopal episode.  Today patient is doing well without any acute cardiovascular concerns or complaints.  She denies any recurrent syncope/presyncope.  She denies any chest pains or any further anginal symptoms.  Notes she has not taken her blood pressure at home since starting on amlodipine .  Notes she is doing well overall but would like to improve her overall health.  She returned her heart monitor into the mail on 07/21/2023.  Results are currently not available.  She smoked from age 47 and quit last year and replaced smoking with vaping, and recently stopped vaping.  She establishes care with a primary care provider in July 2025.  She denies chest pain, shortness of breath, lower extremity edema, fatigue, palpitations, melena, hematuria, hemoptysis, diaphoresis, weakness, presyncope, syncope, orthopnea, and PND.   Review of systems:  Please see the history of present illness. All other systems are reviewed and otherwise negative.     Home Medications:    Current Meds  Medication Sig   amLODipine  (NORVASC ) 10 MG tablet Take 1 tablet (10 mg total) by mouth daily.   levocetirizine (XYZAL ) 5 MG tablet Take 1 tablet (5 mg total) by mouth every evening.   Multiple Vitamin (MULTIVITAMIN) capsule Take 1 capsule by mouth daily.   rosuvastatin  (CRESTOR ) 10 MG tablet Take 1 tablet (10 mg total) by mouth daily.   [DISCONTINUED] amLODipine  (NORVASC ) 5 MG tablet Take 1 tablet (5 mg total) by mouth daily.   [DISCONTINUED] rosuvastatin  (CRESTOR ) 5 MG tablet Take 1 tablet (5  mg total) by mouth at bedtime.         Studies Reviewed:   Coronary CTA July 08, 2023 1. Coronary calcium  score of 0.   2. Normal coronary origin with right dominance.   3. Mild non-calcified plaque in the proximal LCX (25-49%).   4. Mild non-calcified plaque (25-49%) in the ramus.   RECOMMENDATIONS: 1. CAD-RADS 2: Mild  non-obstructive CAD (25-49%). Consider non-atherosclerotic causes of chest pain. Consider preventive therapy and risk factor modification.  Echocardiogram 07-08-2023  1. Left ventricular ejection fraction, by estimation, is 60 to 65%. The  left ventricle has normal function. The left ventricle has no regional  wall motion abnormalities. Left ventricular diastolic parameters are  indeterminate.   2. Right ventricular systolic function is normal. The right ventricular  size is normal. Tricuspid regurgitation signal is inadequate for assessing  PA pressure.   3. The mitral valve is normal in structure. No evidence of mitral valve  regurgitation. No evidence of mitral stenosis.   4. The aortic valve is tricuspid. Aortic valve regurgitation is not  visualized. Aortic valve sclerosis/calcification is present, without any  evidence of aortic stenosis. Aortic valve area, by VTI measures 2.77 cm.  Aortic valve mean gradient measures  6.0 mmHg. Aortic valve Vmax measures 1.64 m/s.   5. The inferior vena cava is normal in size with greater than 50%  respiratory variability, suggesting right atrial pressure of 3 mmHg.   Risk Assessment/Calculations:     HYPERTENSION CONTROL Vitals:   07/25/23 1408 07/25/23 1636  BP: (!) 140/80 (!) 142/80    The patient's blood pressure is elevated above target today.  In order to address the patient's elevated BP: A current anti-hypertensive medication was adjusted today.          Physical Exam:   VS:  BP (!) 142/80 (BP Location: Left Arm, Patient Position: Sitting, Cuff Size: Normal)   Pulse 78   Ht 5\' 5"  (1.651 m)   Wt 195 lb (88.5 kg)   SpO2 99%   BMI 32.45 kg/m    Wt Readings from Last 3 Encounters:  07/25/23 195 lb (88.5 kg)  07/05/23 184 lb 1.4 oz (83.5 kg)  02/05/20 184 lb 1.6 oz (83.5 kg)    GEN: Well nourished, well developed in no acute distress NECK: No JVD; No carotid bruits CARDIAC: RRR, no murmurs, rubs, gallops RESPIRATORY:   Clear to auscultation without rales, wheezing or rhonchi  ABDOMEN: Soft, non-tender, non-distended EXTREMITIES:  No edema; No acute deformity     Assessment and Plan:  Atypical chest pain Coronary CTA 06/2023 with mild nonobstructive CAD (25-49%) CT chest/abdomen/pelvis showed no evidence of aortic aneurysm or dissection - Today patient is without any anginal symptoms.  She denies any exertional angina today.  She remains chest pain-free since discharge. There is no indication for further ischemic evaluation at this time  Coronary artery disease Hyperlipidemia Coronary CTA 06/2023 showed coronary calcium  score of 0 with mild noncalcified plaque (25-49%) to proximal LCx and in the ramus Echocardiogram 06/2023 with LVEF 60 to 65% with no RWMA LDL 115, HDL 34, TG 93 on 1/61/0960 LDL currently not under good control with goal of less than 70 - Today patient denies any further anginal symptoms.  No indication for further ischemic evaluation at this time - Plan to increase rosuvastatin  from 5 mg daily to 10 mg daily, no myalgias - Repeat fasted lipid panel and 8 to 12 weeks  Hypertension Blood pressure today is 140/80 and repeat 142/80 Blood pressure  currently not under adequate control of less than 130/80 - Plan to increase amlodipine  from 5 mg to 10 mg daily - Maintain home BP log  Presyncope AKI Further described in HPI Thought to be due to uncontrolled HTN or dehydration in setting of AKI/enteritis/cramps Creatinine 1.6 on admission, presumed prerenal that improved with IV fluids with some diarrhea PTA.  Creatinine back to baseline at discharge - Patient denies any recurrent syncope or presyncope - Will aim for tight blood pressure control and maintain adequate dehydration - ZIO that was placed at discharge is currently pending - Repeat BMET today       Dispo:  Return in about 6 months (around 01/25/2024).  Signed, Ava Boatman, NP

## 2023-07-26 ENCOUNTER — Encounter: Payer: Self-pay | Admitting: Emergency Medicine

## 2023-07-27 ENCOUNTER — Ambulatory Visit: Payer: Self-pay | Admitting: Emergency Medicine

## 2023-07-27 DIAGNOSIS — Z79899 Other long term (current) drug therapy: Secondary | ICD-10-CM

## 2023-07-27 LAB — BASIC METABOLIC PANEL WITH GFR
BUN/Creatinine Ratio: 16 (ref 9–23)
BUN: 14 mg/dL (ref 6–24)
CO2: 20 mmol/L (ref 20–29)
Calcium: 10.1 mg/dL (ref 8.7–10.2)
Chloride: 102 mmol/L (ref 96–106)
Creatinine, Ser: 0.88 mg/dL (ref 0.57–1.00)
Glucose: 88 mg/dL (ref 70–99)
Potassium: 4.3 mmol/L (ref 3.5–5.2)
Sodium: 140 mmol/L (ref 134–144)
eGFR: 79 mL/min/{1.73_m2} (ref >59)

## 2023-08-04 NOTE — Addendum Note (Signed)
 Encounter addended by: Coral Der A on: 08/04/2023 9:37 AM  Actions taken: Imaging Exam ended

## 2023-08-11 DIAGNOSIS — I499 Cardiac arrhythmia, unspecified: Secondary | ICD-10-CM | POA: Diagnosis not present

## 2023-08-27 ENCOUNTER — Ambulatory Visit: Payer: Self-pay | Admitting: Cardiology

## 2023-08-29 MED ORDER — METOPROLOL SUCCINATE ER 25 MG PO TB24: 12.5000 mg | ORAL_TABLET | Freq: Every day | ORAL | 3 refills | Status: DC

## 2023-08-29 NOTE — Telephone Encounter (Signed)
 Would you like to add this patient or have or see APP to go over results, I do not see anywhere to schedule her.

## 2023-09-09 NOTE — Progress Notes (Deleted)
 Cardiology Clinic Note   Patient Name: Brenda Sweeney Date of Encounter: 09/09/2023  Primary Care Provider:  Patient, No Pcp Per Primary Cardiologist:  Kardie Tobb, DO  Patient Profile    Brenda Sweeney 53 year old female presents to the clinic today for follow-up evaluation of her near syncope.  Past Medical History    No past medical history on file. Past Surgical History:  Procedure Laterality Date   DILATATION & CURETTAGE/HYSTEROSCOPY WITH MYOSURE N/A 12/19/2019   Procedure: DILATATION & CURETTAGE, HYSTEROSCOPY, MYOMECTOMY;  Surgeon: Herchel Gloris LABOR, MD;  Location: [facility];  Service: Gynecology;  Laterality: N/A;   TUBAL LIGATION      Allergies  Allergies  Allergen Reactions   Prozac [Fluoxetine Hcl] Other (See Comments)    Made my face lock up   Prednisone Other (See Comments)    History of Present Illness    Brenda Sweeney has a PMH of atypical chest pain, coronary artery disease, hypertension, tobacco abuse, hyperlipidemia, near syncope, and AKI.  She was admitted to the hospital 4/22 until 07/07/2023.  She was noted to have presyncope, chest pain, hypertension, and AKI.  Cardiology was consulted.  She was evaluated for EKG changes, presyncope and chest pain.  Prior to admission she had no cardiac history.  Her EKG showed sinus tachycardia and nonspecific ST-T wave changes.  Subsequent EKG showed diffuse T wave inversion in inferior and anterolateral leads.  She was noted to have dizziness at work, lightheadedness, numbness and associated chest pain.  She also noted diarrhea and stomach cramps the prior day.  Her cardiac troponins were 71, 79, 92 and 52.  Coronary CTA showed no coronary calcifications, no pericardial effusion and no evidence of aortic aneurysm or dissection.  Her echocardiogram at that time showed an LVEF of 60-65%, normal RV function and no valvular abnormalities.  Coronary CTA 07/06/2023  showed coronary calcium  score of 0 and mild noncalcified plaque in the proximal circumflex 25-49%.  She was also noted to have mild noncalcified plaque in the ramus.  It was felt that her elevated troponins were related to demand ischemia in the setting of elevated blood pressure and dehydration.  She was chest pain-free at discharge.  She was also discharged with a 2-week cardiac event monitor.  She was seen in follow-up by Lum Louis NP on 07/25/2023.  During that time she was doing well.  She denied chest pain.  She denied presyncope and syncope.  She reported that her blood pressure was well-controlled.  She noted that she had placed her heart rate monitor in the mail on 07/21/2023.  She had plans to see her PCP 7/25.  Cardiac event monitor 08/04/2023 showed paroxysmal supraventricular tachycardia and nonsustained ventricular tachycardia.  She was started on metoprolol succinate 12.5 mg daily  She presents to the clinic today for follow-up evaluation and states***.  *** denies chest pain, shortness of breath, lower extremity edema, fatigue, palpitations, melena, hematuria, hemoptysis, diaphoresis, weakness, presyncope, syncope, orthopnea, and PND.   Palpitations, PSVT-heart rate today***.  Denies further episodes of presyncope, lightheadedness or syncope.  Tolerating metoprolol well. Avoid triggers caffeine, chocolate, EtOH, dehydration etc. Continue metoprolol  Coronary artery disease-had coronary calcium  scoring 07/06/2023 which showed a coronary calcium  score of 0.  She was noted to have mild noncalcified plaque in her proximal circumflex and ramus. Continue amlodipine , metoprolol Start aspirin 81 mg daily***  Essential hypertension-BP today***. Maintain blood pressure log Heart healthy low-sodium diet Continue metoprolol, amlodipine   Presyncope-was noted  to have PSVT and nonsustained VT on cardiac event monitor 08/04/2023.  Details above.  Tolerating metoprolol well.   Maintain p.o.  hydration Change positions slowly May use lower extremity support stockings  Hyperlipidemia-LDL***. High-fiber diet Increase physical activity as tolerated Continue rosuvastatin  Repeat fasting lipids and LFTs  Disposition: Follow-up with Dr. Sheena or me in 4-6 months.  Home Medications    Prior to Admission medications   Medication Sig Start Date End Date Taking? Authorizing Provider  amLODipine  (NORVASC ) 10 MG tablet Take 1 tablet (10 mg total) by mouth daily. 07/25/23   Rana Lum CROME, NP  levocetirizine (XYZAL ) 5 MG tablet Take 1 tablet (5 mg total) by mouth every evening. 02/03/23 08/02/23  Joesph Shaver Scales, PA-C  metoprolol succinate (TOPROL XL) 25 MG 24 hr tablet Take 0.5 tablets (12.5 mg total) by mouth daily. 08/29/23   Tobb, Kardie, DO  Multiple Vitamin (MULTIVITAMIN) capsule Take 1 capsule by mouth daily.    [provider]  rosuvastatin  (CRESTOR ) 10 MG tablet Take 1 tablet (10 mg total) by mouth daily. 07/25/23   Rana Lum CROME, NP    Family History    Family History  Problem Relation Age of Onset   Hypertension Mother    Diabetes Mother    Diabetes Father    Hypertension Father    High Cholesterol Father    Diabetes Brother    Hypertension Brother    Diabetes Paternal Grandmother    Hypertension Paternal Grandmother    She indicated that her mother is alive. She indicated that her father is alive. She indicated that her brother is alive. She indicated that the status of her paternal grandmother is unknown.  Social History    Social History   Socioeconomic History   Marital status: Single    Spouse name: Not on file   Number of children: Not on file   Years of education: Not on file   Highest education level: Some college, no degree  Occupational History   Not on file  Tobacco Use   Smoking status: Former    Current packs/day: 0.25    Average packs/day: 0.3 packs/day for 30.0 years (7.5 ttl pk-yrs)    Types: Cigarettes   Smokeless  tobacco: Never  Vaping Use   Vaping status: Never Used  Substance and Sexual Activity   Alcohol use: No   Drug use: No   Sexual activity: Not Currently    Birth control/protection: Surgical  Other Topics Concern   Not on file  Social History Narrative   Not on file   Social Drivers of Health   Financial Resource Strain: Not on file  Food Insecurity: No Food Insecurity (07/06/2023)   Hunger Vital Sign    Worried About Running Out of Food in the Last Year: Never true    Ran Out of Food in the Last Year: Never true  Transportation Needs: No Transportation Needs (07/06/2023)   PRAPARE - Administrator, Civil Service (Medical): No    Lack of Transportation (Non-Medical): No  Physical Activity: Not on file  Stress: Not on file  Social Connections: Not on file  Intimate Partner Violence: Not At Risk (07/06/2023)   Humiliation, Afraid, Rape, and Kick questionnaire    Fear of Current or Ex-Partner: No    Emotionally Abused: No    Physically Abused: No    Sexually Abused: No     Review of Systems    General:  No chills, fever, night sweats or weight changes.  Cardiovascular:  No chest pain, dyspnea on exertion, edema, orthopnea, palpitations, paroxysmal nocturnal dyspnea. Dermatological: No rash, lesions/masses Respiratory: No cough, dyspnea Urologic: No hematuria, dysuria Abdominal:   No nausea, vomiting, diarrhea, bright red blood per rectum, melena, or hematemesis Neurologic:  No visual changes, wkns, changes in mental status. All other systems reviewed and are otherwise negative except as noted above.  Physical Exam    VS:  There were no vitals taken for this visit. , BMI There is no height or weight on file to calculate BMI. GEN: Well nourished, well developed, in no acute distress. HEENT: normal. Neck: Supple, no JVD, carotid bruits, or masses. Cardiac: RRR, no murmurs, rubs, or gallops. No clubbing, cyanosis, edema.  Radials/DP/PT 2+ and equal bilaterally.   Respiratory:  Respirations regular and unlabored, clear to auscultation bilaterally. GI: Soft, nontender, nondistended, BS + x 4. MS: no deformity or atrophy. Skin: warm and dry, no rash. Neuro:  Strength and sensation are intact. Psych: Normal affect.  Accessory Clinical Findings    Recent Labs: 07/05/2023: ALT 19; Magnesium 1.8; TSH 2.317 07/06/2023: Hemoglobin 15.1; Platelets 260 07/25/2023: BUN 14; Creatinine, Ser 0.88; Potassium 4.3; Sodium 140   Recent Lipid Panel    Component Value Date/Time   CHOL 168 07/06/2023 0930   CHOL 204 (H) 01/21/2020 1115   TRIG 93 07/06/2023 0930   HDL 34 (L) 07/06/2023 0930   HDL 69 01/21/2020 1115   CHOLHDL 4.9 07/06/2023 0930   VLDL 19 07/06/2023 0930   LDLCALC 115 (H) 07/06/2023 0930   LDLCALC 124 (H) 01/21/2020 1115    No BP recorded.  {Refresh Note OR Click here to enter BP  :1}***    ECG personally reviewed by me today- ***     Cardiac event monitor 07/07/2023   Patch Wear Time:  13 days and 19 hours (2025-04-24T09:23:44-0400 to 2025-05-08T05:21:41-0400)   Patient had a min HR of 45 bpm, max HR of 190 bpm, and avg HR of 83 bpm. Predominant underlying rhythm was Sinus Rhythm.    3 Ventricular Tachycardia runs occurred, the run with the fastest interval lasting 4 beats with a max rate of 167 bpm, the longest lasting 7 beats with an avg rate of 111 bpm. 5 Supraventricular Tachycardia runs occurred, the run with the fastest interval lasting 13 beats with a max rate of 190 bpm (avg 148 bpm); the run with the fastest interval was also the longest. Isolated SVEs were rare (<1.0%), SVE Couplets were rare (<1.0%), and SVE Triplets were rare (<1.0%). Isolated VEs were rare (<1.0%, 1393), VE Couplets were rare (<1.0%, 30), and VE Triplets were rare (<1.0%, 3).    No symptoms reported.     Conclusion: This study showed the following:            1.  Nonsustained ventricular tachycardia            2.  Paroxysmal supraventricular  tachycardia.  Coronary CTA 07/06/2023  1. Coronary calcium  score of 0.   2. Normal coronary origin with right dominance.   3. Mild non-calcified plaque in the proximal LCX (25-49%).   4. Mild non-calcified plaque (25-49%) in the ramus.   RECOMMENDATIONS: 1. CAD-RADS 2: Mild non-obstructive CAD (25-49%). Consider non-atherosclerotic causes of chest pain. Consider preventive therapy and risk factor modification.   Echocardiogram 07/06/2023  1. Left ventricular ejection fraction, by estimation, is 60 to 65%. The  left ventricle has normal function. The left ventricle has no regional  wall motion abnormalities. Left ventricular diastolic parameters  are  indeterminate.   2. Right ventricular systolic function is normal. The right ventricular  size is normal. Tricuspid regurgitation signal is inadequate for assessing  PA pressure.   3. The mitral valve is normal in structure. No evidence of mitral valve  regurgitation. No evidence of mitral stenosis.   4. The aortic valve is tricuspid. Aortic valve regurgitation is not  visualized. Aortic valve sclerosis/calcification is present, without any  evidence of aortic stenosis. Aortic valve area, by VTI measures 2.77 cm.  Aortic valve mean gradient measures  6.0 mmHg. Aortic valve Vmax measures 1.64 m/s.   5. The inferior vena cava is normal in size with greater than 50%  respiratory variability, suggesting right atrial pressure of 3 mmHg.     Assessment & Plan   1.  ***   Josefa HERO. Percilla Tweten NP-C     09/09/2023, 12:53 PM [facility] [address] Office [phone] Fax [phone]    I spent***minutes examining this patient, reviewing medications, and using patient centered shared decision making involving their cardiac care.   I spent  20 minutes reviewing past medical history,  medications, and prior cardiac tests.

## 2023-09-12 ENCOUNTER — Ambulatory Visit: Attending: General Practice | Admitting: General Practice

## 2023-09-14 ENCOUNTER — Encounter: Payer: Self-pay | Admitting: General Practice

## 2023-09-26 ENCOUNTER — Telehealth: Payer: Self-pay | Admitting: General Practice

## 2023-09-26 ENCOUNTER — Encounter: Admitting: Family

## 2023-09-26 NOTE — Telephone Encounter (Signed)
 Called pt and left vm to call office back to reschedule missed NP appt at [facility]

## 2023-09-26 NOTE — Progress Notes (Signed)
 Erroneous encounter-disregard

## 2024-01-31 ENCOUNTER — Encounter: Payer: Self-pay | Admitting: *Deleted

## 2024-02-01 ENCOUNTER — Encounter: Payer: Self-pay | Admitting: Cardiology

## 2024-02-01 ENCOUNTER — Other Ambulatory Visit (HOSPITAL_COMMUNITY): Payer: Self-pay

## 2024-02-01 ENCOUNTER — Ambulatory Visit: Attending: Cardiovascular Disease | Admitting: Cardiology

## 2024-02-01 VITALS — BP 140/94 | HR 56 | Ht 65.0 in | Wt 196.0 lb

## 2024-02-01 DIAGNOSIS — I2510 Atherosclerotic heart disease of native coronary artery without angina pectoris: Secondary | ICD-10-CM | POA: Diagnosis not present

## 2024-02-01 DIAGNOSIS — E785 Hyperlipidemia, unspecified: Secondary | ICD-10-CM | POA: Insufficient documentation

## 2024-02-01 DIAGNOSIS — I10 Essential (primary) hypertension: Secondary | ICD-10-CM | POA: Insufficient documentation

## 2024-02-01 MED ORDER — CARVEDILOL 6.25 MG PO TABS
6.2500 mg | ORAL_TABLET | Freq: Two times a day (BID) | ORAL | 3 refills | Status: AC
  Filled 2024-02-01: qty 180, 90d supply, fill #0

## 2024-02-01 NOTE — Patient Instructions (Signed)
 Medication Instructions:  Your physician has recommended you make the following change in your medication:  STOP: Toprol -XL START: Coreg  6.25 mg twice daily  *If you need a refill on your cardiac medications before your next appointment, please call your pharmacy*  Follow-Up: At [facility], you and your health needs are our priority.  As part of our continuing mission to provide you with exceptional heart care, our providers are all part of one team.  This team includes your primary Cardiologist (physician) and Advanced Practice Providers or APPs (Physician Assistants and Nurse Practitioners) who all work together to provide you with the care you need, when you need it.  Your next appointment:   4 weeks with pharm-D or [place]  6 month(s)  Provider:   Kardie Tobb, DO

## 2024-02-04 NOTE — Progress Notes (Unsigned)
 Cardiology Office Note:    Date:  02/07/2024   ID:  Brenda Sweeney, DOB [birth year], MRN [id number]  PCP:  Patient, No Pcp Per  Cardiologist:  Dub Huntsman, DO  Electrophysiologist:  None   Referring MD: No ref. provider found     History of Present Illness:    Brenda Sweeney is a 53 y.o. female with a hx of hypertension, coronary artery disease, tobacco abuse.  I first met the patient while she was hospitalized at [facility].  Postdischarge she was seen by Brenda Louis, NP.  During that visit she was hypertensive blood pressure medication was appropriately adjusted.  Since then overall she tells me she has been doing well.  Today she reported that she has been experiencing numbness in her legs when sitting, particularly during bathroom use, for about a month. The numbness resolves upon standing. No other complaints at this time.   No past medical history on file.  Past Surgical History:  Procedure Laterality Date   DILATATION & CURETTAGE/HYSTEROSCOPY WITH MYOSURE N/A 12/19/2019   Procedure: DILATATION & CURETTAGE, HYSTEROSCOPY, MYOMECTOMY;  Surgeon: Brenda Sweeney LABOR, MD;  Location: [facility];  Service: Gynecology;  Laterality: N/A;   TUBAL LIGATION      Current Medications: Current Meds  Medication Sig   amLODipine  (NORVASC ) 10 MG tablet Take 1 tablet (10 mg total) by mouth daily.   carvedilol  (COREG ) 6.25 MG tablet Take 1 tablet (6.25 mg total) by mouth 2 (two) times daily.   Multiple Vitamin (MULTIVITAMIN) capsule Take 1 capsule by mouth daily.   rosuvastatin  (CRESTOR ) 10 MG tablet Take 1 tablet (10 mg total) by mouth daily.   [DISCONTINUED] metoprolol  succinate (TOPROL  XL) 25 MG 24 hr tablet Take 0.5 tablets (12.5 mg total) by mouth daily.     Allergies:   Prozac [fluoxetine hcl] and Prednisone   Social History   Socioeconomic History   Marital status: Single    Spouse name: Not on file   Number of children:  Not on file   Years of education: Not on file   Highest education level: Some college, no degree  Occupational History   Not on file  Tobacco Use   Smoking status: Former    Current packs/day: 0.25    Average packs/day: 0.3 packs/day for 30.0 years (7.5 ttl pk-yrs)    Types: Cigarettes   Smokeless tobacco: Never  Vaping Use   Vaping status: Never Used  Substance and Sexual Activity   Alcohol use: No   Drug use: No   Sexual activity: Not Currently    Birth control/protection: Surgical  Other Topics Concern   Not on file  Social History Narrative   Not on file   Social Drivers of Health   Financial Resource Strain: Not on file  Food Insecurity: No Food Insecurity (07/06/2023)   Hunger Vital Sign    Worried About Running Out of Food in the Last Year: Never true    Ran Out of Food in the Last Year: Never true  Transportation Needs: No Transportation Needs (07/06/2023)   PRAPARE - Administrator, Civil Service (Medical): No    Lack of Transportation (Non-Medical): No  Physical Activity: Not on file  Stress: Not on file  Social Connections: Not on file     Family History: The patient's family history includes Diabetes in her brother, father, mother, and paternal grandmother; High Cholesterol in her father; Hypertension in her brother, father, mother, and paternal grandmother.  ROS:  Review of Systems  Constitution: Negative for decreased appetite, fever and weight gain.  HENT: Negative for congestion, ear discharge, hoarse voice and sore throat.   Eyes: Negative for discharge, redness, vision loss in right eye and visual halos.  Cardiovascular: Negative for chest pain, dyspnea on exertion, leg swelling, orthopnea and palpitations.  Respiratory: Negative for cough, hemoptysis, shortness of breath and snoring.   Endocrine: Negative for heat intolerance and polyphagia.  Hematologic/Lymphatic: Negative for bleeding problem. Does not bruise/bleed easily.  Skin:  Negative for flushing, nail changes, rash and suspicious lesions.  Musculoskeletal: Negative for arthritis, joint pain, muscle cramps, myalgias, neck pain and stiffness.  Gastrointestinal: Negative for abdominal pain, bowel incontinence, diarrhea and excessive appetite.  Genitourinary: Negative for decreased libido, genital sores and incomplete emptying.  Neurological: Negative for brief paralysis, focal weakness, headaches and loss of balance.  Psychiatric/Behavioral: Negative for altered mental status, depression and suicidal ideas.  Allergic/Immunologic: Negative for HIV exposure and persistent infections.    EKGs/Labs/Other Studies Reviewed:    The following studies were reviewed today:   EKG:  The ekg ordered today demonstrates   Recent Labs: 07/05/2023: ALT 19; Magnesium 1.8; TSH 2.317 07/06/2023: Hemoglobin 15.1; Platelets 260 07/25/2023: BUN 14; Creatinine, Ser 0.88; Potassium 4.3; Sodium 140  Recent Lipid Panel    Component Value Date/Time   CHOL 168 07/06/2023 0930   CHOL 204 (H) 01/21/2020 1115   TRIG 93 07/06/2023 0930   HDL 34 (L) 07/06/2023 0930   HDL 69 01/21/2020 1115   CHOLHDL 4.9 07/06/2023 0930   VLDL 19 07/06/2023 0930   LDLCALC 115 (H) 07/06/2023 0930   LDLCALC 124 (H) 01/21/2020 1115    Physical Exam:    VS:  BP (!) 140/94 (BP Location: Right Arm, Patient Position: Sitting, Cuff Size: Normal)   Pulse (!) 56   Ht 5' 5 (1.651 m)   Wt 196 lb (88.9 kg)   SpO2 97%   BMI 32.62 kg/m     Wt Readings from Last 3 Encounters:  02/01/24 196 lb (88.9 kg)  07/25/23 195 lb (88.5 kg)  07/05/23 184 lb 1.4 oz (83.5 kg)     GEN: Well nourished, well developed in no acute distress HEENT: Normal NECK: No JVD; No carotid bruits LYMPHATICS: No lymphadenopathy CARDIAC: S1S2 noted,RRR, no murmurs, rubs, gallops RESPIRATORY:  Clear to auscultation without rales, wheezing or rhonchi  ABDOMEN: Soft, non-tender, non-distended, +bowel sounds, no guarding. EXTREMITIES:  No edema, No cyanosis, no clubbing MUSCULOSKELETAL:  No deformity  SKIN: Warm and dry NEUROLOGIC:  Alert and oriented x 3, non-focal PSYCHIATRIC:  Normal affect, good insight  ASSESSMENT:    1. Primary hypertension   2. Coronary artery disease involving native coronary artery of native heart without angina pectoris   3. Hyperlipidemia LDL goal <70    PLAN:    Essential hypertension -she is not hypertensive - Discontinued metoprolol . - Prescribed Coreg  6 mg twice daily. - Continue amlodipine . - Follow-up in four weeks to assess medication efficacy.  Coronary Artery disease-no anginal symptoms continue current management.  Hyperlipidemia - continue with current statin medication.  The patient understands the need to lose weight with diet and exercise. We have discussed specific strategies for this.   The patient is in agreement with the above plan. The patient left the office in stable condition.  The patient will follow up in   Medication Adjustments/Labs and Tests Ordered: Current medicines are reviewed at length with the patient today.  Concerns regarding medicines are outlined above.  Orders Placed This Encounter  Procedures   AMB Referral to [facility] Pharm-D   Meds ordered this encounter  Medications   carvedilol  (COREG ) 6.25 MG tablet    Sig: Take 1 tablet (6.25 mg total) by mouth 2 (two) times daily.    Dispense:  180 tablet    Refill:  3    Patient Instructions  Medication Instructions:  Your physician has recommended you make the following change in your medication:  STOP: Toprol -XL START: Coreg  6.25 mg twice daily  *If you need a refill on your cardiac medications before your next appointment, please call your pharmacy*  Follow-Up: At [facility], you and your health needs are our priority.  As part of our continuing mission to provide you with exceptional heart care, our providers are all part of one team.  This team includes your primary  Cardiologist (physician) and Advanced Practice Providers or APPs (Physician Assistants and Nurse Practitioners) who all work together to provide you with the care you need, when you need it.  Your next appointment:   4 weeks with pharm-D or [place]  6 month(s)  Provider:   Primus Gritton, DO      Adopting a Healthy Lifestyle.  Know what a healthy weight is for you (roughly BMI <25) and aim to maintain this   Aim for 7+ servings of fruits and vegetables daily   65-80+ fluid ounces of water or unsweet tea for healthy kidneys   Limit to max 1 drink of alcohol per day; avoid smoking/tobacco   Limit animal fats in diet for cholesterol and heart health - choose grass fed whenever available   Avoid highly processed foods, and foods high in saturated/trans fats   Aim for low stress - take time to unwind and care for your mental health   Aim for 150 min of moderate intensity exercise weekly for heart health, and weights twice weekly for bone health   Aim for 7-9 hours of sleep daily   When it comes to diets, agreement about the perfect plan isnt easy to find, even among the experts. Experts at the [facility] of [employer] developed an idea known as the Healthy Eating Plate. Just imagine a plate divided into logical, healthy portions.   The emphasis is on diet quality:   Load up on vegetables and fruits - one-half of your plate: Aim for color and variety, and remember that potatoes dont count.   Go for whole grains - one-quarter of your plate: Whole wheat, barley, wheat berries, quinoa, oats, brown rice, and foods made with them. If you want pasta, go with whole wheat pasta.   Protein power - one-quarter of your plate: Fish, chicken, beans, and nuts are all healthy, versatile protein sources. Limit red meat.   The diet, however, does go beyond the plate, offering a few other suggestions.   Use healthy plant oils, such as olive, canola, soy, corn, sunflower and peanut. Check  the labels, and avoid partially hydrogenated oil, which have unhealthy trans fats.   If youre thirsty, drink water. Coffee and tea are good in moderation, but skip sugary drinks and limit milk and dairy products to one or two daily servings.   The type of carbohydrate in the diet is more important than the amount. Some sources of carbohydrates, such as vegetables, fruits, whole grains, and beans-are healthier than others.   Finally, stay active  Signed, Dub Huntsman, DO  02/07/2024 2:57 PM    [facility]

## 2024-02-28 ENCOUNTER — Ambulatory Visit: Attending: Emergency Medicine | Admitting: Emergency Medicine

## 2024-02-28 ENCOUNTER — Encounter: Payer: Self-pay | Admitting: Emergency Medicine

## 2024-02-28 VITALS — BP 112/78 | HR 59 | Ht 65.0 in | Wt 200.0 lb

## 2024-02-28 DIAGNOSIS — I499 Cardiac arrhythmia, unspecified: Secondary | ICD-10-CM | POA: Diagnosis not present

## 2024-02-28 DIAGNOSIS — E785 Hyperlipidemia, unspecified: Secondary | ICD-10-CM

## 2024-02-28 DIAGNOSIS — I471 Supraventricular tachycardia, unspecified: Secondary | ICD-10-CM

## 2024-02-28 DIAGNOSIS — I159 Secondary hypertension, unspecified: Secondary | ICD-10-CM | POA: Diagnosis not present

## 2024-02-28 DIAGNOSIS — R0789 Other chest pain: Secondary | ICD-10-CM | POA: Diagnosis not present

## 2024-02-28 DIAGNOSIS — I10 Essential (primary) hypertension: Secondary | ICD-10-CM

## 2024-02-28 DIAGNOSIS — I2510 Atherosclerotic heart disease of native coronary artery without angina pectoris: Secondary | ICD-10-CM

## 2024-02-28 NOTE — Progress Notes (Signed)
 Cardiology Office Note:    Date:  02/28/2024  ID:  Brenda Sweeney, DOB 01/31/1971, MRN [id number] PCP: Patient, No Pcp Per  [facility] Providers Cardiologist:  Dub Huntsman, DO Cardiology APP:  Rana Lum CROME, NP       Patient Profile:       Chief Complaint: 1 month follow-up for hypertension History of Present Illness:  Brenda Sweeney is a 54 y.o. female with visit-pertinent history of coronary artery disease, hypertension, tobacco abuse   Patient was admitted to the hospital from 4/22 - 07/07/2023 for presyncope, chest pain, hypertension, and AKI.  Patient was seen by cardiology service on 07/06/2023 for further evaluation of presyncope, chest pains, and EKG changes.  Patient had no medical history prior to being seen.  Initial EKG showed sinus tachycardia with nonspecific ST-T wave changes.  Subsequent EKG showed diffuse T wave inversions in inferior and anterior lateral leads.  She had noted she was at work the day prior she began to feel dizzy, lightheaded, numb, with associated chest pain.  She was having diarrhea and stomach cramps the day prior.  Her troponins were 71, 79, 92, 52.  Chest CTA did not show any coronary calcifications, no pericardial effusion, no evidence of aortic aneurysm or dissection.  Echocardiogram was completed on 07/06/2023 showing LVEF of 60 to 65%, no RWMA, RV function and size normal, no valvular abnormalities.  Coronary CTA was ordered and completed on 07/06/2023 showing coronary calcium  score of 0 with mild noncalcified plaque in the proximal LCx (25-49%) and mild noncalcified plaque in the ramus (25-49%).  Thought to be possibly BP mediated or related to dehydration.  Chest pain had completely resolved at discharge.  She was discharged home on 2-week live ZIO monitor.  Seen in clinic on 07/25/2023.  Blood pressure was elevated 142/80 and amlodipine  was increased to 10 mg daily.  No recurrent syncope or anginal  symptoms.  ZIO 08/04/2023 showed nonsustained ventricular tachycardia with 3 runs occurring, longest lasting 7 beats with average rate of 111 bpm and 5 supraventricular tachycardia runs occurred with the run with the fastest and longest interval lasting 13 beats with a max rate of 190 bpm.  Last seen in clinic on 02/01/2024.  Blood pressure was elevated and the metoprolol  was discontinued for carvedilol  6.25 mg twice daily.   Discussed the use of AI scribe software for clinical note transcription with the patient, who gave verbal consent to proceed.  History of Present Illness Brenda Sweeney is a 53 year old female with hypertension and atypical chest pain who presents for follow-up on blood pressure management.  Today she is without acute or new cardiovascular concerns or complaints.  Reports her blood pressure is under much better control since starting carvedilol .  She will occasionally experience daytime fatigue but this is not bothersome to her.  She continues to experience mild atypical chest pains that are rare.  She denies any dyspnea, orthopnea, PND, LEE.  She is without any exertional symptoms.  She takes rosuvastatin  for cholesterol, with a prior LDL of 115 mg/dL.  She has gained weight from 195 to 200 pounds since her last visit and is not very active. She plans to focus on dietary changes for weight loss.  She has had no recent presyncope and stays well hydrated. She has not yet seen her primary care doctor but plans to establish within the coming month.  She denies syncope, melena, hematochezia, palpitation     Review of systems:  Please  see the history of present illness. All other systems are reviewed and otherwise negative.      Studies Reviewed:    EKG Interpretation Date/Time:  Tuesday February 28 2024 08:18:01 EST Ventricular Rate:  59 PR Interval:  160 QRS Duration:  70 QT Interval:  428 QTC Calculation: 423 R Axis:   -5  Text  Interpretation: Sinus bradycardia Septal infarct (cited on or before 06-Jul-2023) T wave abnormality, consider inferior ischemia T wave abnormality, consider anterolateral ischemia When compared with ECG of 06-Jul-2023 10:38, Questionable change in initial forces of Septal leads Confirmed by Rana Dixon [phone]) on 02/28/2024 9:05:02 AM   Zio 07/07/2023 Patch Wear Time:  13 days and 19 hours (2025-04-24T09:23:44-0400 to 2025-05-08T05:21:41-0400)   Patient had a min HR of 45 bpm, max HR of 190 bpm, and avg HR of 83 bpm. Predominant underlying rhythm was Sinus Rhythm.    3 Ventricular Tachycardia runs occurred, the run with the fastest interval lasting 4 beats with a max rate of 167 bpm, the longest lasting 7 beats with an avg rate of 111 bpm. 5 Supraventricular Tachycardia runs occurred, the run with the fastest interval lasting 13 beats with a max rate of 190 bpm (avg 148 bpm); the run with the fastest interval was also the longest. Isolated SVEs were rare (<1.0%), SVE Couplets were rare (<1.0%), and SVE Triplets were rare (<1.0%). Isolated VEs were rare (<1.0%, 1393), VE Couplets were rare (<1.0%, 30), and VE Triplets were rare (<1.0%, 3).    No symptoms reported.     Conclusion: This study showed the following:            1.  Nonsustained ventricular tachycardia            2.  Paroxysmal supraventricular tachycardia.  Coronary CTA 07/06/2023 1. Coronary calcium  score of 0.   2. Normal coronary origin with right dominance.   3. Mild non-calcified plaque in the proximal LCX (25-49%).   4. Mild non-calcified plaque (25-49%) in the ramus.   RECOMMENDATIONS: 1. CAD-RADS 2: Mild non-obstructive CAD (25-49%). Consider non-atherosclerotic causes of chest pain. Consider preventive therapy and risk factor modification.   Echocardiogram 07/06/2023  1. Left ventricular ejection fraction, by estimation, is 60 to 65%. The  left ventricle has normal function. The left ventricle has no regional   wall motion abnormalities. Left ventricular diastolic parameters are  indeterminate.   2. Right ventricular systolic function is normal. The right ventricular  size is normal. Tricuspid regurgitation signal is inadequate for assessing  PA pressure.   3. The mitral valve is normal in structure. No evidence of mitral valve  regurgitation. No evidence of mitral stenosis.   4. The aortic valve is tricuspid. Aortic valve regurgitation is not  visualized. Aortic valve sclerosis/calcification is present, without any  evidence of aortic stenosis. Aortic valve area, by VTI measures 2.77 cm.  Aortic valve mean gradient measures  6.0 mmHg. Aortic valve Vmax measures 1.64 m/s.   5. The inferior vena cava is normal in size with greater than 50%  respiratory variability, suggesting right atrial pressure of 3 mmHg.   Risk Assessment/Calculations:              Physical Exam:   VS:  BP 112/78   Pulse (!) 59   Ht 5' 5 (1.651 m)   Wt 200 lb (90.7 kg)   BMI 33.28 kg/m    Wt Readings from Last 3 Encounters:  02/28/24 200 lb (90.7 kg)  02/01/24 196 lb (88.9 kg)  07/25/23 195 lb (88.5 kg)    GEN: Well nourished, well developed in no acute distress NECK: No JVD; No carotid bruits CARDIAC: RRR, no murmurs, rubs, gallops RESPIRATORY:  Clear to auscultation without rales, wheezing or rhonchi  ABDOMEN: Soft, non-tender, non-distended EXTREMITIES:  No edema; No acute deformity      Assessment and Plan:  Coronary artery disease Hyperlipidemia, LDL goal <70 Coronary CTA 07/7972 showed coronary calcium  score of 0 with mild noncalcified plaque (25-49%) to proximal LCx and ramus Echocardiogram 06/2023 with LVEF 60 to 65% with no RWMA LDL 115 on 07/06/2023 - EKG today with similar TWI in inferior and anterolateral leads to prior EKG 06/2023 without acute changes - Today patient is stable without anginal symptoms.  No exertional chest pains.  No indication of further ischemic evaluation at this time -  Continue rosuvastatin  10 mg daily - Fasting lipid panel and LFTs today.  Can consider increasing rosuvastatin  if needed to reach goal   Hypertension Blood pressure today is well-controlled at 112/78 Reports mild fatigue on carvedilol  but not bothersome - Continue carvedilol  6.25 mg twice daily and amlodipine  10 mg daily  NSVT PSVT ZIO 07/2023 showed 3 VT runs, the longest lasting 7 beats with an average rate of 111 bpm.  5 SVT runs occurred, the run with the fastest and longest interval lasting 13 beats at a max rate of 190 bpm - Quiescent with no reported palpitations  - Continue carvedilol  6.25 mg twice      Dispo:  Return in about 6 months (around 08/28/2024).  Signed, Lum LITTIE Louis, NP

## 2024-02-28 NOTE — Patient Instructions (Signed)
 Medication Instructions:  NO CHANGES  Lab Work: FASTING LIPID PANEL AND LFTs TO BE DONE TODAY.  Testing/Procedures: NONE  Follow-Up: At [facility], you and your health needs are our priority.  As part of our continuing mission to provide you with exceptional heart care, our providers are all part of one team.  This team includes your primary Cardiologist (physician) and Advanced Practice Providers or APPs (Physician Assistants and Nurse Practitioners) who all work together to provide you with the care you need, when you need it.  Your next appointment:   6 MONTHS  Provider:   Kardie Tobb, DO OR Lum Louis, NP

## 2024-02-29 LAB — HEPATIC FUNCTION PANEL
ALT: 23 IU/L (ref 0–32)
AST: 17 IU/L (ref 0–40)
Albumin: 4.4 g/dL (ref 3.8–4.9)
Alkaline Phosphatase: 103 IU/L (ref 49–135)
Bilirubin Total: 0.2 mg/dL (ref 0.0–1.2)
Bilirubin, Direct: 0.09 mg/dL (ref 0.00–0.40)
Total Protein: 6.9 g/dL (ref 6.0–8.5)

## 2024-02-29 LAB — LIPID PANEL
Chol/HDL Ratio: 2.6 ratio (ref 0.0–4.4)
Cholesterol, Total: 172 mg/dL (ref 100–199)
HDL: 66 mg/dL (ref >39)
LDL Chol Calc (NIH): 88 mg/dL (ref 0–99)
Triglycerides: 97 mg/dL (ref 0–149)
VLDL Cholesterol Cal: 18 mg/dL (ref 5–40)

## 2024-03-05 ENCOUNTER — Other Ambulatory Visit (HOSPITAL_COMMUNITY): Payer: Self-pay

## 2024-03-05 MED ORDER — ROSUVASTATIN CALCIUM 20 MG PO TABS
20.0000 mg | ORAL_TABLET | Freq: Every day | ORAL | 3 refills | Status: DC
  Filled 2024-03-05: qty 90, 90d supply, fill #0

## 2024-03-05 MED ORDER — ROSUVASTATIN CALCIUM 20 MG PO TABS
20.0000 mg | ORAL_TABLET | Freq: Every day | ORAL | 3 refills | Status: AC
  Filled 2024-03-05: qty 90, 90d supply, fill #0

## 2024-03-05 NOTE — Addendum Note (Signed)
 Addended by: BILLY CAMELIA CROME on: 03/05/2024 09:02 AM   Modules accepted: Orders

## 2024-03-05 NOTE — Addendum Note (Signed)
 Addended by: BILLY CAMELIA CROME on: 03/05/2024 09:04 AM   Modules accepted: Orders

## 2024-04-13 ENCOUNTER — Ambulatory Visit: Payer: Self-pay | Admitting: Family Medicine

## 2024-04-13 ENCOUNTER — Other Ambulatory Visit: Payer: Self-pay | Admitting: Family Medicine

## 2024-04-13 ENCOUNTER — Other Ambulatory Visit (HOSPITAL_COMMUNITY): Payer: Self-pay

## 2024-04-13 ENCOUNTER — Encounter: Payer: Self-pay | Admitting: Family Medicine

## 2024-04-13 VITALS — BP 120/64 | HR 64 | Temp 98.2°F | Ht 65.0 in | Wt 203.0 lb

## 2024-04-13 DIAGNOSIS — Z1211 Encounter for screening for malignant neoplasm of colon: Secondary | ICD-10-CM

## 2024-04-13 DIAGNOSIS — Z2821 Immunization not carried out because of patient refusal: Secondary | ICD-10-CM

## 2024-04-13 DIAGNOSIS — Z7689 Persons encountering health services in other specified circumstances: Secondary | ICD-10-CM

## 2024-04-13 DIAGNOSIS — E559 Vitamin D deficiency, unspecified: Secondary | ICD-10-CM | POA: Insufficient documentation

## 2024-04-13 DIAGNOSIS — Z6833 Body mass index (BMI) 33.0-33.9, adult: Secondary | ICD-10-CM

## 2024-04-13 DIAGNOSIS — Z1159 Encounter for screening for other viral diseases: Secondary | ICD-10-CM | POA: Insufficient documentation

## 2024-04-13 DIAGNOSIS — E6609 Other obesity due to excess calories: Secondary | ICD-10-CM

## 2024-04-13 DIAGNOSIS — I10 Essential (primary) hypertension: Secondary | ICD-10-CM

## 2024-04-13 DIAGNOSIS — L309 Dermatitis, unspecified: Secondary | ICD-10-CM | POA: Insufficient documentation

## 2024-04-13 DIAGNOSIS — Z1231 Encounter for screening mammogram for malignant neoplasm of breast: Secondary | ICD-10-CM

## 2024-04-13 DIAGNOSIS — N6489 Other specified disorders of breast: Secondary | ICD-10-CM

## 2024-04-13 DIAGNOSIS — E66811 Obesity, class 1: Secondary | ICD-10-CM | POA: Insufficient documentation

## 2024-04-13 MED ORDER — TRIAMCINOLONE ACETONIDE 0.5 % EX CREA
1.0000 | TOPICAL_CREAM | Freq: Two times a day (BID) | CUTANEOUS | 0 refills | Status: AC | PRN
  Filled 2024-04-13: qty 60, 30d supply, fill #0

## 2024-04-13 NOTE — Progress Notes (Unsigned)
 Brenda Sweeney,Brenda Sweeney, CMA,acting as a neurosurgeon for [employer], NP.,have documented all relevant documentation on the behalf of Brenda Creighton, NP,as directed by  Brenda Creighton, NP while in the presence of Brenda Creighton, NP.  Subjective:  Patient ID: Brenda Sweeney , female    DOB: 11/21/1970 , 54 y.o.   MRN: [id number]  Chief Complaint  Patient presents with   Establish Care    Patient presents today to establish care. Patient reports she hasn't seen a pcp in a long time.    Hypertension    Patient presents today for a bp check. Patient reports compliance with her meds. Patient denies having any chest pain, sob or headaches at this time. Patient is followed by cardiology as well.    Hyperlipidemia    HPI Discussed the use of AI scribe software for clinical note transcription with the patient, who gave verbal consent to proceed.  History of Present Illness     [name] is a 54 year old female who presents to establish care and for a comprehensive health check-up.  She is currently managed by a cardiologist for hypertension, high cholesterol, and cardiac arrhythmia. She takes Coreg  10 mg twice daily, which was increased from once daily due to difficulty in controlling her blood pressure.  She has a history of eczema, primarily affecting her hands, which worsens with exposure to water and certain soaps. Her work at [employer] and [employer] involves frequent exposure to health net. She uses raw cocoa butter to manage dryness and is considering seeing a dermatologist for further management.  She has a history of a suicide attempt in her twenties using prednisone and Prozac, which were not prescribed to her. She has since overcome this period and attributes her recovery to her faith and family support.  She has stopped smoking since last year, which she notes has positively impacted her blood pressure. Her family, including her mother, sister, and brother, have  also quit smoking, and she is encouraging her oldest son to do the same.  She is menopausal and has not had a mammogram in over three years. She is interested in scheduling a mammogram and a colonoscopy, as she has never had one before. She previously received a Cologuard test kit but did not complete it.  She takes a multivitamin and is considering calcium  supplementation due to her age. She is also interested in checking her vitamin D  levels, as she understands its importance for heart health and mood regulation.   Hypertension This is a chronic problem. The problem has been waxing and waning since onset. Associated symptoms include anxiety. Risk factors for coronary artery disease include post-menopausal state and family history. Past treatments include calcium  channel blockers and beta blockers. Hypertensive end-organ damage includes left ventricular hypertrophy.     Past Medical History:  Diagnosis Date   Cardiac arrhythmia    Hyperlipidemia    Hypertension      Family History  Problem Relation Age of Onset   Hypertension Mother    Diabetes Mother    Diabetes Father    Hypertension Father    High Cholesterol Father    Diabetes Brother    Hypertension Brother    Diabetes Maternal Grandmother    Diabetes Paternal Grandmother    Hypertension Paternal Grandmother    Hypertension Paternal Grandfather    Diabetes Paternal Grandfather     Current Medications[1]   Allergies[2]   Review of Systems  Constitutional: Negative.   Eyes: Negative.  Respiratory: Negative.    Cardiovascular: Negative.   Gastrointestinal: Negative.   Musculoskeletal: Negative.   Skin:  Positive for color change and rash.  Neurological: Negative.   Psychiatric/Behavioral: Negative.       Today's Vitals   04/13/24 0908  BP: 120/64  Pulse: 64  Temp: 98.2 F (36.8 C)  TempSrc: Oral  Weight: 203 lb (92.1 kg)  Height: 5' 5 (1.651 m)  PainSc: 0-No pain   Body mass index is 33.78 kg/m.  Wt  Readings from Last 3 Encounters:  04/13/24 203 lb (92.1 kg)  02/28/24 200 lb (90.7 kg)  02/01/24 196 lb (88.9 kg)    The ASCVD Risk score (Arnett DK, et al., 2019) failed to calculate for the following reasons:   Unable to determine if patient is Non-Hispanic African American  Objective:  Physical Exam Constitutional:      Appearance: Normal appearance.  HENT:     Head: Normocephalic.  Cardiovascular:     Rate and Rhythm: Normal rate and regular rhythm.     Pulses: Normal pulses.     Heart sounds: Normal heart sounds.  Pulmonary:     Effort: Pulmonary effort is normal.     Breath sounds: Normal breath sounds.  Abdominal:     General: Bowel sounds are normal.  Skin:    General: Skin is dry.     Findings: Erythema and rash present.  Neurological:     Mental Status: She is alert and oriented to person, place, and time. Mental status is at baseline.         Assessment And Plan:   Assessment & Plan Encounter to establish care with new doctor  Primary hypertension Hypertension is well-controlled with Coreg . - Continue Coreg  6.25 mg twice daily and Amlodipine  10 mg every day   Eczema of both hands Chronic eczema with recent exacerbation, likely due to occupational exposure. - Prescribed topical steroid cream for hands. - Referred to dermatologist for further evaluation and management. Vitamin D  deficiency - Checked vitamin D  levels.  Need for hepatitis C screening test - Ordered hepatitis C screening. Need for hepatitis B screening test Ordered hepatitis B screening Screening mammogram for breast cancer - Ordered mammogram. Screening for colon cancer - Referred to GI for colonoscopy. Class 1 obesity due to excess calories with serious comorbidity and body mass index (BMI) of 33.0 to 33.9 in adult She is encouraged to strive for BMI less than 30 to decrease cardiac risk. Advised to aim for at least 150 minutes of exercise per week.      Return in about 5 months  (around 09/11/2024) for bpc and chol check, physical.  Patient was given opportunity to ask questions. Patient verbalized understanding of the plan and was able to repeat key elements of the plan. All questions were answered to their satisfaction.    Brenda Sweeney, Brenda Creighton, NP, have reviewed all documentation for this visit. The documentation on 04/18/2024 for the exam, diagnosis, procedures, and orders are all accurate and complete.    IF YOU HAVE BEEN REFERRED TO A SPECIALIST, IT MAY TAKE 1-2 WEEKS TO SCHEDULE/PROCESS THE REFERRAL. IF YOU HAVE NOT HEARD FROM US /SPECIALIST IN TWO WEEKS, PLEASE GIVE US  A CALL AT [phone].       [1]  Current Outpatient Medications:    amLODipine  (NORVASC ) 10 MG tablet, Take 1 tablet (10 mg total) by mouth daily., Disp: 90 tablet, Rfl: 3   carvedilol  (COREG ) 6.25 MG tablet, Take 1 tablet (6.25 mg total) by  mouth 2 (two) times daily., Disp: 180 tablet, Rfl: 3   Multiple Vitamin (MULTIVITAMIN) capsule, Take 1 capsule by mouth daily., Disp: , Rfl:    rosuvastatin  (CRESTOR ) 20 MG tablet, Take 1 tablet (20 mg total) by mouth daily., Disp: 90 tablet, Rfl: 3   triamcinolone  cream (KENALOG ) 0.5 %, Apply 1 Application topically 2 (two) times daily as needed., Disp: 454 g, Rfl: 0 [2]  Allergies Allergen Reactions   Prozac [Fluoxetine Hcl] Other (See Comments)    Made my face lock up   Prednisone Other (See Comments)

## 2024-04-13 NOTE — Patient Instructions (Signed)
 Hypertension, Adult Hypertension is another name for high blood pressure. High blood pressure forces your heart to work harder to pump blood. This can cause problems over time. There are two numbers in a blood pressure reading. There is a top number (systolic) over a bottom number (diastolic). It is best to have a blood pressure that is below 120/80. What are the causes? The cause of this condition is not known. Some other conditions can lead to high blood pressure. What increases the risk? Some lifestyle factors can make you more likely to develop high blood pressure: Smoking. Not getting enough exercise or physical activity. Being overweight. Having too much fat, sugar, calories, or salt (sodium) in your diet. Drinking too much alcohol. Other risk factors include: Having any of these conditions: Heart disease. Diabetes. High cholesterol. Kidney disease. Obstructive sleep apnea. Having a family history of high blood pressure and high cholesterol. Age. The risk increases with age. Stress. What are the signs or symptoms? High blood pressure may not cause symptoms. Very high blood pressure (hypertensive crisis) may cause: Headache. Fast or uneven heartbeats (palpitations). Shortness of breath. Nosebleed. Vomiting or feeling like you may vomit (nauseous). Changes in how you see. Very bad chest pain. Feeling dizzy. Seizures. How is this treated? This condition is treated by making healthy lifestyle changes, such as: Eating healthy foods. Exercising more. Drinking less alcohol. Your doctor may prescribe medicine if lifestyle changes do not help enough and if: Your top number is above 130. Your bottom number is above 80. Your personal target blood pressure may vary. Follow these instructions at home: Eating and drinking  If told, follow the DASH eating plan. To follow this plan: Fill one half of your plate at each meal with fruits and vegetables. Fill one fourth of your plate  at each meal with whole grains. Whole grains include whole-wheat pasta, brown rice, and whole-grain bread. Eat or drink low-fat dairy products, such as skim milk or low-fat yogurt. Fill one fourth of your plate at each meal with low-fat (lean) proteins. Low-fat proteins include fish, chicken without skin, eggs, beans, and tofu. Avoid fatty meat, cured and processed meat, or chicken with skin. Avoid pre-made or processed food. Limit the amount of salt in your diet to less than 1,500 mg each day. Do not drink alcohol if: Your doctor tells you not to drink. You are pregnant, may be pregnant, or are planning to become pregnant. If you drink alcohol: Limit how much you have to: 0-1 drink a day for women. 0-2 drinks a day for men. Know how much alcohol is in your drink. In the [place] one drink equals one 12 oz bottle of beer (355 mL), one 5 oz glass of wine (148 mL), or one 1 oz glass of hard liquor (44 mL). Lifestyle  Work with your doctor to stay at a healthy weight or to lose weight. Ask your doctor what the best weight is for you. Get at least 30 minutes of exercise that causes your heart to beat faster (aerobic exercise) most days of the week. This may include walking, swimming, or biking. Get at least 30 minutes of exercise that strengthens your muscles (resistance exercise) at least 3 days a week. This may include lifting weights or doing Pilates. Do not smoke or use any products that contain nicotine or tobacco. If you need help quitting, ask your doctor. Check your blood pressure at home as told by your doctor. Keep all follow-up visits. Medicines Take over-the-counter and prescription medicines  only as told by your doctor. Follow directions carefully. Do not skip doses of blood pressure medicine. The medicine does not work as well if you skip doses. Skipping doses also puts you at risk for problems. Ask your doctor about side effects or reactions to medicines that you should watch  for. Contact a doctor if: You think you are having a reaction to the medicine you are taking. You have headaches that keep coming back. You feel dizzy. You have swelling in your ankles. You have trouble with your vision. Get help right away if: You get a very bad headache. You start to feel mixed up (confused). You feel weak or numb. You feel faint. You have very bad pain in your: Chest. Belly (abdomen). You vomit more than once. You have trouble breathing. These symptoms may be an emergency. Get help right away. Call 911. Do not wait to see if the symptoms will go away. Do not drive yourself to the hospital. Summary Hypertension is another name for high blood pressure. High blood pressure forces your heart to work harder to pump blood. For most people, a normal blood pressure is less than 120/80. Making healthy choices can help lower blood pressure. If your blood pressure does not get lower with healthy choices, you may need to take medicine. This information is not intended to replace advice given to you by your health care provider. Make sure you discuss any questions you have with your health care provider. Document Revised: 12/18/2020 Document Reviewed: 12/18/2020 Elsevier Patient Education  2024 [employer].

## 2024-04-14 LAB — CMP14+EGFR
ALT: 22 [IU]/L (ref 0–32)
AST: 19 [IU]/L (ref 0–40)
Albumin: 4.4 g/dL (ref 3.8–4.9)
Alkaline Phosphatase: 129 [IU]/L (ref 49–135)
BUN/Creatinine Ratio: 13 (ref 9–23)
BUN: 10 mg/dL (ref 6–24)
Bilirubin Total: 0.6 mg/dL (ref 0.0–1.2)
CO2: 22 mmol/L (ref 20–29)
Calcium: 10.2 mg/dL (ref 8.7–10.2)
Chloride: 102 mmol/L (ref 96–106)
Creatinine, Ser: 0.8 mg/dL (ref 0.57–1.00)
Globulin, Total: 2.8 g/dL (ref 1.5–4.5)
Glucose: 106 mg/dL — ABNORMAL HIGH (ref 70–99)
Potassium: 4.1 mmol/L (ref 3.5–5.2)
Sodium: 139 mmol/L (ref 134–144)
Total Protein: 7.2 g/dL (ref 6.0–8.5)
eGFR: 88 mL/min/{1.73_m2}

## 2024-04-14 LAB — CBC
Hematocrit: 44.8 % (ref 34.0–46.6)
Hemoglobin: 14.9 g/dL (ref 11.1–15.9)
MCH: 32.2 pg (ref 26.6–33.0)
MCHC: 33.3 g/dL (ref 31.5–35.7)
MCV: 97 fL (ref 79–97)
Platelets: 338 10*3/uL (ref 150–450)
RBC: 4.63 x10E6/uL (ref 3.77–5.28)
RDW: 13.7 % (ref 11.7–15.4)
WBC: 9.2 10*3/uL (ref 3.4–10.8)

## 2024-04-14 LAB — HEPATITIS B SURFACE ANTIBODY,QUALITATIVE: Hep B Surface Ab, Qual: REACTIVE

## 2024-04-14 LAB — HEPATITIS C ANTIBODY: Hep C Virus Ab: NONREACTIVE

## 2024-04-14 LAB — VITAMIN D 25 HYDROXY (VIT D DEFICIENCY, FRACTURES): Vit D, 25-Hydroxy: 7.4 ng/mL — ABNORMAL LOW (ref 30.0–100.0)

## 2024-04-19 ENCOUNTER — Other Ambulatory Visit (HOSPITAL_COMMUNITY): Payer: Self-pay

## 2024-04-19 ENCOUNTER — Ambulatory Visit: Payer: Self-pay | Admitting: Family Medicine

## 2024-04-19 MED ORDER — VITAMIN D (ERGOCALCIFEROL) 1.25 MG (50000 UNIT) PO CAPS
50000.0000 [IU] | ORAL_CAPSULE | ORAL | 2 refills | Status: AC
  Filled 2024-04-19: qty 12, 84d supply, fill #0

## 2024-04-19 NOTE — Assessment & Plan Note (Signed)
 Chronic eczema with recent exacerbation, likely due to occupational exposure. - Prescribed topical steroid cream for hands. - Referred to dermatologist for further evaluation and management.

## 2024-04-19 NOTE — Assessment & Plan Note (Signed)
 She is encouraged to strive for BMI less than 30 to decrease cardiac risk. Advised to aim for at least 150 minutes of exercise per week.

## 2024-04-19 NOTE — Progress Notes (Signed)
 Your vitamin D  level is low, 50,000 units of vitamin D  has been sent to the pharmacy. Take one caplet weekly . All your other labs are normal including kidneys .  Thanks

## 2024-04-19 NOTE — Assessment & Plan Note (Signed)
 Ordered hepatitis B screening

## 2024-04-19 NOTE — Assessment & Plan Note (Signed)
 Ordered mammogram.

## 2024-04-19 NOTE — Assessment & Plan Note (Signed)
-   Ordered hepatitis C screening.

## 2024-04-19 NOTE — Assessment & Plan Note (Signed)
 Hypertension is well-controlled with Coreg . - Continue Coreg  6.25 mg twice daily and Amlodipine  10 mg every day

## 2024-04-19 NOTE — Assessment & Plan Note (Signed)
-   Checked vitamin D levels.

## 2024-04-19 NOTE — Assessment & Plan Note (Signed)
 Referred to GI for colonoscopy

## 2024-04-25 ENCOUNTER — Encounter

## 2024-09-12 ENCOUNTER — Encounter: Payer: Self-pay | Admitting: Family Medicine

## 2025-01-08 ENCOUNTER — Ambulatory Visit: Admitting: Physician Assistant
